# Patient Record
Sex: Male | Born: 1980 | Race: Black or African American | Hispanic: No | Marital: Single | Smoking: Never smoker
Health system: Southern US, Community
[De-identification: ages and names within clinical notes are randomized; demographics above are authoritative.]

## PROBLEM LIST (undated history)

## (undated) DIAGNOSIS — E119 Type 2 diabetes mellitus without complications: Secondary | ICD-10-CM

## (undated) DIAGNOSIS — F32A Depression, unspecified: Secondary | ICD-10-CM

## (undated) DIAGNOSIS — E669 Obesity, unspecified: Secondary | ICD-10-CM

## (undated) DIAGNOSIS — F329 Major depressive disorder, single episode, unspecified: Secondary | ICD-10-CM

## (undated) DIAGNOSIS — I10 Essential (primary) hypertension: Secondary | ICD-10-CM

---

## 2010-05-01 ENCOUNTER — Emergency Department (INDEPENDENT_AMBULATORY_CARE_PROVIDER_SITE_OTHER): Payer: Self-pay

## 2010-05-01 ENCOUNTER — Emergency Department (HOSPITAL_BASED_OUTPATIENT_CLINIC_OR_DEPARTMENT_OTHER)
Admission: EM | Admit: 2010-05-01 | Discharge: 2010-05-01 | Disposition: A | Discharge status: Home / Self Care | Payer: Self-pay | Attending: Emergency Medicine | Admitting: Emergency Medicine

## 2010-05-01 DIAGNOSIS — J069 Acute upper respiratory infection, unspecified: Secondary | ICD-10-CM | POA: Insufficient documentation

## 2010-05-01 DIAGNOSIS — R0789 Other chest pain: Secondary | ICD-10-CM

## 2010-05-01 DIAGNOSIS — I10 Essential (primary) hypertension: Secondary | ICD-10-CM | POA: Insufficient documentation

## 2010-05-01 DIAGNOSIS — R05 Cough: Secondary | ICD-10-CM

## 2010-05-01 DIAGNOSIS — R509 Fever, unspecified: Secondary | ICD-10-CM

## 2010-05-01 DIAGNOSIS — R059 Cough, unspecified: Secondary | ICD-10-CM | POA: Insufficient documentation

## 2010-07-26 ENCOUNTER — Inpatient Hospital Stay (INDEPENDENT_AMBULATORY_CARE_PROVIDER_SITE_OTHER)
Admission: RE | Admit: 2010-07-26 | Discharge: 2010-07-26 | Disposition: A | Discharge status: Home / Self Care | Payer: Self-pay | Source: Ambulatory Visit | Attending: Emergency Medicine | Admitting: Emergency Medicine

## 2010-07-26 DIAGNOSIS — L738 Other specified follicular disorders: Secondary | ICD-10-CM

## 2012-07-02 ENCOUNTER — Emergency Department (HOSPITAL_BASED_OUTPATIENT_CLINIC_OR_DEPARTMENT_OTHER)
Admission: EM | Admit: 2012-07-02 | Discharge: 2012-07-02 | Disposition: A | Discharge status: Home / Self Care | Payer: Self-pay | Attending: Emergency Medicine | Admitting: Emergency Medicine

## 2012-07-02 ENCOUNTER — Emergency Department (HOSPITAL_BASED_OUTPATIENT_CLINIC_OR_DEPARTMENT_OTHER): Payer: Self-pay

## 2012-07-02 ENCOUNTER — Encounter (HOSPITAL_BASED_OUTPATIENT_CLINIC_OR_DEPARTMENT_OTHER): Payer: Self-pay | Admitting: *Deleted

## 2012-07-02 DIAGNOSIS — Y929 Unspecified place or not applicable: Secondary | ICD-10-CM | POA: Insufficient documentation

## 2012-07-02 DIAGNOSIS — I10 Essential (primary) hypertension: Secondary | ICD-10-CM | POA: Insufficient documentation

## 2012-07-02 DIAGNOSIS — X58XXXA Exposure to other specified factors, initial encounter: Secondary | ICD-10-CM | POA: Insufficient documentation

## 2012-07-02 DIAGNOSIS — T148XXA Other injury of unspecified body region, initial encounter: Secondary | ICD-10-CM

## 2012-07-02 DIAGNOSIS — Y939 Activity, unspecified: Secondary | ICD-10-CM | POA: Insufficient documentation

## 2012-07-02 DIAGNOSIS — IMO0002 Reserved for concepts with insufficient information to code with codable children: Secondary | ICD-10-CM | POA: Insufficient documentation

## 2012-07-02 HISTORY — DX: Essential (primary) hypertension: I10

## 2012-07-02 LAB — URINALYSIS, ROUTINE W REFLEX MICROSCOPIC
Bilirubin Urine: NEGATIVE
Glucose, UA: NEGATIVE mg/dL
Hgb urine dipstick: NEGATIVE
Protein, ur: NEGATIVE mg/dL
Urobilinogen, UA: 1 mg/dL (ref 0.0–1.0)

## 2012-07-02 MED ORDER — IBUPROFEN 800 MG PO TABS: 800.0000 mg | ORAL_TABLET | Freq: Three times a day (TID) | ORAL | Status: DC

## 2012-07-02 MED ORDER — METHOCARBAMOL 500 MG PO TABS: 500.0000 mg | ORAL_TABLET | Freq: Two times a day (BID) | ORAL | Status: DC

## 2012-07-02 NOTE — ED Provider Notes (Signed)
History     CSN: [id number]  Arrival date & time 07/02/12  1528   First MD Initiated Contact with Patient 07/02/12 1636      Chief Complaint  Patient presents with  . Back Pain    (Consider location/radiation/quality/duration/timing/severity/associated sxs/prior treatment) Patient is a 32 y.o. male presenting with back pain. The history is provided by the patient.  Back Pain Location:  Lumbar spine Quality:  Aching Radiates to:  Does not radiate Pain severity:  Moderate Pain is:  Same all the time Onset quality:  Gradual Timing:  Constant Progression:  Worsening Chronicity:  New Relieved by:  Nothing Worsened by:  Nothing tried Ineffective treatments:  None tried Pt repor  Past Medical History  Diagnosis Date  . Hypertension     History reviewed. No pertinent past surgical history.  No family history on file.  History  Substance Use Topics  . Smoking status: Never Smoker   . Smokeless tobacco: Not on file  . Alcohol Use: No      Review of Systems  Musculoskeletal: Positive for back pain.  All other systems reviewed and are negative.    Allergies  Review of patient's allergies indicates no known allergies.  Home Medications   Current Outpatient Rx  Name  Route  Sig  Dispense  Refill  . LISINOPRIL PO   Oral   Take by mouth.         Marland Kitchen ibuprofen (ADVIL,MOTRIN) 800 MG tablet   Oral   Take 1 tablet (800 mg total) by mouth 3 (three) times daily.   21 tablet   0   . methocarbamol (ROBAXIN) 500 MG tablet   Oral   Take 1 tablet (500 mg total) by mouth 2 (two) times daily.   20 tablet   0     BP 159/101  Pulse 88  Temp(Src) 98.5 F (36.9 C) (Oral)  Resp 20  Ht 6' 2.5" (1.892 m)  Wt 375 lb (170.099 kg)  BMI 47.52 kg/m2  SpO2 100%  Physical Exam  Nursing note and vitals reviewed. Constitutional: He appears well-developed.  HENT:  Head: Normocephalic and atraumatic.  Eyes: Pupils are equal, round, and reactive to light.  Neck: Normal  range of motion.  Cardiovascular: Normal rate.   Pulmonary/Chest: Effort normal.  Abdominal: Soft.  Musculoskeletal:  Tender thoracic spine diffusely  Neurological: He is alert.  Skin: Skin is warm.    ED Course  Procedures (including critical care time)  Labs Reviewed  URINALYSIS, ROUTINE W REFLEX MICROSCOPIC - Abnormal; Notable for the following:    Color, Urine AMBER (*)    Specific Gravity, Urine 1.031 (*)    Ketones, ur 15 (*)    All other components within normal limits   Dg Chest 2 View  07/02/2012   *RADIOLOGY REPORT*  Clinical Data: Pain  CHEST - 2 VIEW  Comparison: 05/01/2010  Findings: The heart size and mediastinal contours are within normal limits.  Both lungs are clear.  The visualized skeletal structures are unremarkable.  IMPRESSION: Negative exam.   Original Report Authenticated By: Signa Kell, M.D.     1. Muscle strain       MDM  Ibuprofen and robaxin        Elson Areas, PA-C 07/02/12 1928

## 2012-07-02 NOTE — ED Notes (Signed)
Mid back pain x 3 weeks.

## 2012-07-02 NOTE — ED Notes (Signed)
No trouble walking or urinating.

## 2012-07-03 NOTE — ED Provider Notes (Signed)
Medical screening examination/treatment/procedure(s) were performed by non-physician practitioner and as supervising physician I was immediately available for consultation/collaboration.   Mcarthur Ivins, MD 07/03/12 1504 

## 2016-02-29 ENCOUNTER — Emergency Department (HOSPITAL_BASED_OUTPATIENT_CLINIC_OR_DEPARTMENT_OTHER)
Admission: EM | Admit: 2016-02-29 | Discharge: 2016-02-29 | Disposition: A | Discharge status: Home / Self Care | Payer: Self-pay | Attending: Emergency Medicine | Admitting: Emergency Medicine

## 2016-02-29 ENCOUNTER — Encounter (HOSPITAL_BASED_OUTPATIENT_CLINIC_OR_DEPARTMENT_OTHER): Payer: Self-pay | Admitting: Emergency Medicine

## 2016-02-29 ENCOUNTER — Emergency Department (HOSPITAL_BASED_OUTPATIENT_CLINIC_OR_DEPARTMENT_OTHER): Payer: Self-pay

## 2016-02-29 DIAGNOSIS — R51 Headache: Secondary | ICD-10-CM | POA: Insufficient documentation

## 2016-02-29 DIAGNOSIS — Z79899 Other long term (current) drug therapy: Secondary | ICD-10-CM | POA: Insufficient documentation

## 2016-02-29 DIAGNOSIS — R002 Palpitations: Secondary | ICD-10-CM | POA: Insufficient documentation

## 2016-02-29 DIAGNOSIS — R0789 Other chest pain: Secondary | ICD-10-CM

## 2016-02-29 DIAGNOSIS — H53149 Visual discomfort, unspecified: Secondary | ICD-10-CM | POA: Insufficient documentation

## 2016-02-29 DIAGNOSIS — R519 Headache, unspecified: Secondary | ICD-10-CM

## 2016-02-29 DIAGNOSIS — I10 Essential (primary) hypertension: Secondary | ICD-10-CM | POA: Insufficient documentation

## 2016-02-29 HISTORY — DX: Depression, unspecified: F32.A

## 2016-02-29 HISTORY — DX: Major depressive disorder, single episode, unspecified: F32.9

## 2016-02-29 HISTORY — DX: Obesity, unspecified: E66.9

## 2016-02-29 MED ORDER — ONDANSETRON HCL 4 MG/2ML IJ SOLN: 4.0000 mg | Freq: Once | INTRAMUSCULAR | Status: DC

## 2016-02-29 NOTE — ED Notes (Signed)
ED Provider at bedside discussing test results and dispo plan of care. 

## 2016-02-29 NOTE — ED Provider Notes (Signed)
[facility]   CSN: [id number] Arrival date & time: 02/29/16  1717  By signing my name below, I, Modena Jansky, attest that this documentation has been prepared under the direction and in the presence of Geoffery Lyons, MD. Electronically Signed: Modena Jansky, Scribe. 02/29/2016. 6:12 PM.  History   Chief Complaint Chief Complaint  Patient presents with  . Headache    The history is provided by the patient. No language interpreter was used.  Headache   This is a new problem. The current episode started more than 1 week ago. The problem occurs every few hours. The problem has not changed since onset.The headache is associated with emotional stress. The pain is located in the frontal region. The pain is moderate. Associated symptoms include palpitations.   HPI Comments: Wyatt Price is a 36 y.o. male who presents to the Emergency Department complaining of intermittent moderate right-sided frontal headache about a month ago. He states he has been stressed out at work today. He reports associated increased heart rate, photophobia, and chest tightness (onset today). He reports he has had URI-like symptoms and was diagnosed with bronchitis recently. He denies any recent head trauma, visual disturbance, numbness/tingling, or other complaints.     PCP: Johny Shears, MD  Past Medical History:  Diagnosis Date  . Depression   . Hypertension   . Obesity     There are no active problems to display for this patient.   History reviewed. No pertinent surgical history.     Home Medications    Prior to Admission medications   Medication Sig Start Date End Date Taking? Authorizing Provider  citalopram (CELEXA) 20 MG tablet Take 20 mg by mouth daily.   Yes Historical Provider, MD  ibuprofen (ADVIL,MOTRIN) 800 MG tablet Take 1 tablet (800 mg total) by mouth 3 (three) times daily. 07/02/12   Elson Areas, PA-C  LISINOPRIL PO Take by mouth.    Historical Provider,  MD  methocarbamol (ROBAXIN) 500 MG tablet Take 1 tablet (500 mg total) by mouth 2 (two) times daily. 07/02/12   Elson Areas, PA-C    Family History No family history on file.  Social History Social History  Substance Use Topics  . Smoking status: Never Smoker  . Smokeless tobacco: Never Used  . Alcohol use No     Allergies   Patient has no known allergies.   Review of Systems Review of Systems  Eyes: Negative for visual disturbance.  Respiratory: Positive for chest tightness.   Cardiovascular: Positive for palpitations.  Neurological: Positive for headaches. Negative for numbness.  All other systems reviewed and are negative.    Physical Exam Updated Vital Signs BP 147/99 (BP Location: Right Arm)   Pulse 82   Temp 98.3 F (36.8 C) (Oral)   Resp 16   Ht 6' 2.5" (1.892 m)   Wt (!) 360 lb 12.8 oz (163.7 kg)   SpO2 100%   BMI 45.70 kg/m   Physical Exam  Constitutional: He is oriented to person, place, and time. He appears well-developed and well-nourished.  HENT:  Head: Normocephalic and atraumatic.  Eyes: EOM are normal. Pupils are equal, round, and reactive to light.  Neck: Normal range of motion.  Cardiovascular: Normal rate, regular rhythm, normal heart sounds and intact distal pulses.   Pulmonary/Chest: Effort normal and breath sounds normal. No respiratory distress.  Abdominal: Soft. He exhibits no distension. There is no tenderness.  Musculoskeletal: Normal range of motion.  Neurological: He is alert and  oriented to person, place, and time. No cranial nerve deficit. He exhibits normal muscle tone. Coordination normal.  Skin: Skin is warm and dry.  Psychiatric: He has a normal mood and affect. Judgment normal.  Nursing note and vitals reviewed.    ED Treatments / Results  DIAGNOSTIC STUDIES: Oxygen Saturation is 100% on RA, normal by my interpretation.    COORDINATION OF CARE: 6:17 PM- Pt advised of plan for treatment and pt agrees.  Labs (all  labs ordered are listed, but only abnormal results are displayed) Labs Reviewed - No data to display  EKG  EKG Interpretation None       Radiology No results found.  Procedures Procedures (including critical care time)  Medications Ordered in ED Medications - No data to display   Initial Impression / Assessment and Plan / ED Course  I have reviewed the triage vital signs and the nursing notes.  Pertinent labs & imaging results that were available during my care of the patient were reviewed by me and considered in my medical decision making (see chart for details).  Patient presents here with complaints of headache that has been ongoing for the past several weeks. He is neurologically intact and his head CT is negative. He also reports an episode of tightness in his chest. EKG is unremarkable and chest x-ray is clear. I see no indication for further workup at this time. He will be discharged, to return as needed for any problems.  Final Clinical Impressions(s) / ED Diagnoses   Final diagnoses:  None    New Prescriptions New Prescriptions   No medications on file   I personally performed the services described in this documentation, which was scribed in my presence. The recorded information has been reviewed and is accurate.        Geoffery Lyonsouglas Slayter Moorhouse, MD 02/29/16 [phone]

## 2016-02-29 NOTE — Discharge Instructions (Signed)
Ibuprofen 600 mg every 6 hours as needed for pain.  Return to the ER if you develop worsening headache, visual disturbances, chest pains, or other new and concerning symptoms.

## 2016-02-29 NOTE — ED Triage Notes (Signed)
Pt having headaches approx 3 times/week for at least a month.  Takes Advil.  Wants to be sure "everything is OK."  Headache pain 1/10 at this time.  Also states he is having tightness in his chest today when he breathes.  Sts he was recently diagnosed with Bronchitis and the SOB started back again today.

## 2018-06-10 ENCOUNTER — Encounter (HOSPITAL_BASED_OUTPATIENT_CLINIC_OR_DEPARTMENT_OTHER): Payer: Self-pay | Admitting: *Deleted

## 2018-06-10 ENCOUNTER — Emergency Department (HOSPITAL_BASED_OUTPATIENT_CLINIC_OR_DEPARTMENT_OTHER)
Admission: EM | Admit: 2018-06-10 | Discharge: 2018-06-10 | Disposition: A | Discharge status: Home / Self Care | Payer: BLUE CROSS/BLUE SHIELD | Attending: Emergency Medicine | Admitting: Emergency Medicine

## 2018-06-10 ENCOUNTER — Other Ambulatory Visit: Payer: Self-pay

## 2018-06-10 ENCOUNTER — Emergency Department (HOSPITAL_BASED_OUTPATIENT_CLINIC_OR_DEPARTMENT_OTHER): Payer: BLUE CROSS/BLUE SHIELD

## 2018-06-10 DIAGNOSIS — R5383 Other fatigue: Secondary | ICD-10-CM | POA: Diagnosis not present

## 2018-06-10 DIAGNOSIS — M549 Dorsalgia, unspecified: Secondary | ICD-10-CM | POA: Insufficient documentation

## 2018-06-10 DIAGNOSIS — R000 Tachycardia, unspecified: Secondary | ICD-10-CM | POA: Diagnosis not present

## 2018-06-10 DIAGNOSIS — Z79899 Other long term (current) drug therapy: Secondary | ICD-10-CM | POA: Insufficient documentation

## 2018-06-10 DIAGNOSIS — R079 Chest pain, unspecified: Secondary | ICD-10-CM | POA: Diagnosis not present

## 2018-06-10 DIAGNOSIS — R05 Cough: Secondary | ICD-10-CM | POA: Diagnosis not present

## 2018-06-10 DIAGNOSIS — I10 Essential (primary) hypertension: Secondary | ICD-10-CM | POA: Insufficient documentation

## 2018-06-10 DIAGNOSIS — R030 Elevated blood-pressure reading, without diagnosis of hypertension: Secondary | ICD-10-CM

## 2018-06-10 LAB — CBC WITH DIFFERENTIAL/PLATELET
Abs Immature Granulocytes: 0.09 10*3/uL — ABNORMAL HIGH (ref 0.00–0.07)
Basophils Absolute: 0.1 10*3/uL (ref 0.0–0.1)
Basophils Relative: 1 %
Eosinophils Absolute: 0.3 10*3/uL (ref 0.0–0.5)
Eosinophils Relative: 2 %
HCT: 45.7 % (ref 39.0–52.0)
Hemoglobin: 13.4 g/dL (ref 13.0–17.0)
Immature Granulocytes: 1 %
Lymphocytes Relative: 28 %
Lymphs Abs: 4.4 10*3/uL — ABNORMAL HIGH (ref 0.7–4.0)
MCH: 21.8 pg — ABNORMAL LOW (ref 26.0–34.0)
MCHC: 29.3 g/dL — ABNORMAL LOW (ref 30.0–36.0)
MCV: 74.4 fL — ABNORMAL LOW (ref 80.0–100.0)
Monocytes Absolute: 1.5 10*3/uL — ABNORMAL HIGH (ref 0.1–1.0)
Monocytes Relative: 10 %
Neutro Abs: 9.3 10*3/uL — ABNORMAL HIGH (ref 1.7–7.7)
Neutrophils Relative %: 58 %
Platelets: 365 10*3/uL (ref 150–400)
RBC: 6.14 MIL/uL — ABNORMAL HIGH (ref 4.22–5.81)
RDW: 15.3 % (ref 11.5–15.5)
WBC: 15.7 10*3/uL — ABNORMAL HIGH (ref 4.0–10.5)
nRBC: 0 % (ref 0.0–0.2)

## 2018-06-10 LAB — BASIC METABOLIC PANEL
Anion gap: 7 (ref 5–15)
BUN: 7 mg/dL (ref 6–20)
CO2: 30 mmol/L (ref 22–32)
Calcium: 8.9 mg/dL (ref 8.9–10.3)
Chloride: 102 mmol/L (ref 98–111)
Creatinine, Ser: 0.87 mg/dL (ref 0.61–1.24)
GFR calc Af Amer: >60 mL/min (ref >60)
GFR calc non Af Amer: >60 mL/min (ref >60)
Glucose, Bld: 110 mg/dL — ABNORMAL HIGH (ref 70–99)
Potassium: 3.5 mmol/L (ref 3.5–5.1)
Sodium: 139 mmol/L (ref 135–145)

## 2018-06-10 LAB — URINALYSIS, ROUTINE W REFLEX MICROSCOPIC
Bilirubin Urine: NEGATIVE
Glucose, UA: NEGATIVE mg/dL
Hgb urine dipstick: NEGATIVE
Ketones, ur: NEGATIVE mg/dL
Leukocytes,Ua: NEGATIVE
Nitrite: NEGATIVE
Protein, ur: NEGATIVE mg/dL
Specific Gravity, Urine: 1.02 (ref 1.005–1.030)
pH: 6 (ref 5.0–8.0)

## 2018-06-10 LAB — RAPID URINE DRUG SCREEN, HOSP PERFORMED
Amphetamines: NOT DETECTED
Barbiturates: NOT DETECTED
Benzodiazepines: NOT DETECTED
Cocaine: NOT DETECTED
Opiates: NOT DETECTED
Tetrahydrocannabinol: NOT DETECTED

## 2018-06-10 LAB — TSH: TSH: 3.313 u[IU]/mL (ref 0.350–4.500)

## 2018-06-10 LAB — TROPONIN I: Troponin I: <0.03 ng/mL (ref <0.03)

## 2018-06-10 MED ORDER — IOHEXOL 350 MG/ML SOLN
100.0000 mL | Freq: Once | INTRAVENOUS | Status: AC | PRN
  Administered 2018-06-10: 100 mL via INTRAVENOUS

## 2018-06-10 MED ORDER — METHOCARBAMOL 500 MG PO TABS: 500.0000 mg | ORAL_TABLET | Freq: Two times a day (BID) | ORAL | 0 refills | Status: DC | PRN

## 2018-06-10 MED ORDER — NAPROXEN 500 MG PO TABS: 500.0000 mg | ORAL_TABLET | Freq: Two times a day (BID) | ORAL | 0 refills | Status: DC | PRN

## 2018-06-10 NOTE — ED Provider Notes (Signed)
[facility] EMERGENCY DEPARTMENT Provider [id number]: [id number] Arrival date & time: 06/10/18  1213    History   Chief Complaint No chief complaint on file.   HPI Wyatt Price is a 38 y.o. male.     The history is provided by the patient and medical records. No language interpreter was used.   Wyatt Price is a 38 y.o. male  with a PMH of HTN, obesity who presents to the Emergency Department with two complaints:  1. Left-sided back pain for approximately 3 weeks. Worse with twisting motions and certain position changes. Tried ibuprofen here and there, maybe some improvement, he isn't quite sure. No abdominal pain, n/v/d. No flank pain. Patient denies neck pain. No fever, saddle anesthesia, weakness, numbness, urinary complaints including dysuria, retention/incontinence. No history of cancer, IVDU, or recent spinal procedures.   2.  Chest pain, fatigue.  Patient states that 2 days ago, he woke up and felt very tired and much more fatigued than usual.  He just felt generally unwell.  He reports a slight cough here and there, but not persistent or bothering him per se.  This continued through the next 2 days, when he awoke this morning, however he developed a central and left-sided chest pain which he describes as " feeling irritated".  Cannot think of any alleviating factors.  He states that it occurs at rest, and times does seem to get worse with deep breathing, but other times deep breathing does not seem to affect pain.  Denies any shortness of breath.  No fevers.  No known sick contacts.    Past Medical History:  Diagnosis Date   Depression    Hypertension    Obesity     There are no active problems to display for this patient.   History reviewed. No pertinent surgical history.      Home Medications    Prior to Admission medications   Medication Sig Start Date End Date Taking? Authorizing Provider  LISINOPRIL PO Take by mouth.   Yes [provider]  venlafaxine XR (EFFEXOR-XR) 75 MG 24 hr capsule Take 75 mg by mouth daily with breakfast.   Yes [provider]  citalopram (CELEXA) 20 MG tablet Take 20 mg by mouth daily.    [provider]  methocarbamol (ROBAXIN) 500 MG tablet Take 1 tablet (500 mg total) by mouth 2 (two) times daily as needed for muscle spasms. 06/10/18   Gwendolyn Mclees, Chase Picket, PA-C  naproxen (NAPROSYN) 500 MG tablet Take 1 tablet (500 mg total) by mouth 2 (two) times daily as needed for mild pain or moderate pain. 06/10/18   Rogelio Waynick, Chase Picket, PA-C    Family History No family history on file.  Social History Social History   Tobacco Use   Smoking status: Never Smoker   Smokeless tobacco: Never Used  Substance Use Topics   Alcohol use: No   Drug use: No     Allergies   Patient has no known allergies.   Review of Systems Review of Systems  Constitutional: Positive for fatigue. Negative for fever.  Respiratory: Positive for cough. Negative for shortness of breath.   Cardiovascular: Positive for chest pain. Negative for palpitations and leg swelling.  Gastrointestinal: Negative for abdominal pain, diarrhea, nausea and vomiting.  Musculoskeletal: Positive for back pain. Negative for neck pain.  Neurological: Negative for weakness and numbness.  All other systems reviewed and are negative.    Physical Exam Updated Vital Signs BP (!) 143/101 (  BP Location: Right Arm)    Pulse 97    Temp 98.8 F (37.1 C) (Oral)    Resp 20    Ht 6\' 2"  (1.88 m)    Wt (!) 175.7 kg    SpO2 100%    BMI 49.73 kg/m   Physical Exam Vitals signs and nursing note reviewed.  Constitutional:      Appearance: He is well-developed.  Neck:     Comments: No midline or paraspinal tenderness. Full ROM without pain. Cardiovascular:     Rate and Rhythm: Regular rhythm.     Heart sounds: Normal heart sounds.  Pulmonary:     Effort: Pulmonary effort is normal. No respiratory distress.     Comments:  Lungs clear to auscultation bilaterally. Abdominal:     General: Bowel sounds are normal. There is no distension.     Palpations: Abdomen is soft.     Tenderness: There is no abdominal tenderness.  Musculoskeletal:       Back:     Comments: No midline T/L tenderness. Tenderness to palpation of left paraspinal musculature as depicted in image. 5/5 muscle strength in bilateral LE's. Straight leg raises are negative bilaterally for radicular symptoms. No calf tenderness. No edema. Able to ambulate independently with steady gait.  Skin:    General: Skin is warm and dry.     Findings: No erythema or rash.  Neurological:     Mental Status: He is alert and oriented to person, place, and time.     Deep Tendon Reflexes: Reflexes are normal and symmetric.     Comments: Bilateral lower extremities neurovascularly intact.      ED Treatments / Results  Labs (all labs ordered are listed, but only abnormal results are displayed) Labs Reviewed  BASIC METABOLIC PANEL - Abnormal; Notable for the following components:      Result Value   Glucose, Bld 110 (*)    All other components within normal limits  CBC WITH DIFFERENTIAL/PLATELET - Abnormal; Notable for the following components:   WBC 15.7 (*)    RBC 6.14 (*)    MCV 74.4 (*)    MCH 21.8 (*)    MCHC 29.3 (*)    Neutro Abs 9.3 (*)    Lymphs Abs 4.4 (*)    Monocytes Absolute 1.5 (*)    Abs Immature Granulocytes 0.09 (*)    All other components within normal limits  TROPONIN I  URINALYSIS, ROUTINE W REFLEX MICROSCOPIC  RAPID URINE DRUG SCREEN, HOSP PERFORMED  TSH    EKG EKG Interpretation  Date/Time:  Thursday Jun 10 2018 12:59:14 EDT Ventricular Rate:  106 PR Interval:    QRS Duration: 90 QT Interval:  320 QTC Calculation: 425 R Axis:   6 Text Interpretation:  Sinus tachycardia Borderline T wave abnormalities no sig change from previous Confirmed by Arby Barrette [phone]) on 06/10/2018 1:50:58 PM   Radiology Dg Chest 2  View  Result Date: 06/10/2018 CLINICAL DATA:  Bilateral chest pain EXAM: CHEST - 2 VIEW COMPARISON:  02/29/2016 FINDINGS: The cardiac size is at the upper limits of normal. Both lungs are clear. The visualized skeletal structures are unremarkable. IMPRESSION: No active cardiopulmonary disease. Electronically Signed   By: Katherine Mantle M.D.   On: 06/10/2018 13:33   Ct Angio Chest Pe W And/or Wo Contrast  Result Date: 06/10/2018 CLINICAL DATA:  Chest pain and tachycardia EXAM: CT ANGIOGRAPHY CHEST WITH CONTRAST TECHNIQUE: Multidetector CT imaging of the chest was performed using the standard protocol during  bolus administration of intravenous contrast. Multiplanar CT image reconstructions and MIPs were obtained to evaluate the vascular anatomy. CONTRAST:  OMNIPAQUE IOHEXOL 350 MG/ML SOLN COMPARISON:  Chest radiograph Jun 10, 2018 FINDINGS: Cardiovascular: There is no demonstrable pulmonary embolus. There is no thoracic aortic aneurysm or dissection. The visualized great vessels appear unremarkable. There is no evident pericardial effusion or pericardial thickening. The main pulmonary outflow tract measures 3.2 cm in diameter, prominent. Mediastinum/Nodes: Thyroid appears normal. There is no demonstrable thoracic adenopathy. No esophageal lesions are evident. Lungs/Pleura: There is no appreciable edema or consolidation. No pleural effusion or pleural thickening evident. There is a 5 x 4 mm nodular opacity in the anterior segment of the right upper lobe, seen on axial slice 46 series 5. Upper Abdomen: Visualized upper abdominal structures appear unremarkable. Musculoskeletal: There are no blastic or lytic bone lesions. There is no evident chest wall lesion. Review of the MIP images confirms the above findings. IMPRESSION: 1. No demonstrable pulmonary embolus. No thoracic aortic aneurysm or dissection. 2. Prominence of the main pulmonary outflow tract, a finding that may be indicative of a degree of  pulmonary arterial hypertension. 3. No edema or consolidation. 5 x 4 mm nodular opacity noted in the anterior segment right upper lobe. No follow-up needed if patient is low-risk. Non-contrast chest CT can be considered in 12 months if patient is high-risk. This recommendation follows the consensus statement: Guidelines for Management of Incidental Pulmonary Nodules Detected on CT Images: From the Fleischner Society 2017; Radiology 2017; 284:228-243. 4.  No demonstrable thoracic adenopathy. Electronically Signed   By: Bretta Bang III M.D.   On: 06/10/2018 14:59    Procedures Procedures (including critical care time)  Medications Ordered in ED Medications  iohexol (OMNIPAQUE) 350 MG/ML injection 100 mL (100 mLs Intravenous Contrast Given 06/10/18 1442)     Initial Impression / Assessment and Plan / ED Course  I have reviewed the triage vital signs and the nursing notes.  Pertinent labs & imaging results that were available during my care of the patient were reviewed by me and considered in my medical decision making (see chart for details).       Wyatt Price is a 38 y.o. male who presents to ED for two complaints:   1.  Left-sided back pain x 3 weeks.  Worse with movement.  No midline tenderness.  No urinary symptoms.  He demonstrates no lower extremity weakness, saddle anesthesia, bowel or bladder incontinence or neuro deficits. No concern for cauda equina. No fevers. Did obtain UA which showed no signs of infection or blood. Doubt stone or infectious etiology. Lower extremities are neurovascularly intact and patient is ambulating without difficulty. Likely msk in etiology. Will treat symptoms and have him follow up with PCP if not improving.    2. Fatigue two days ago, chest pain which began about 6am this morning. Afebrile, dynamically stable with normal cardiopulmonary examination.  EKG unchanged from previous without signs of acute ischemia.  Troponin WDL.  Doubt ACS.  History  and exam not consistent with dissection either.  Chest x-ray without pneumonia or other acute findings.  While on cardiac monitoring, he continued to be tachycardic in the 90-110 range. UDS was negative.  Given tachycardia with chest pain, proceeded with angios which showed no evidence of PE.  Findings of a degree of pulmonary arterial hypertension were seen.  I discussed this with the patient and recommended that he follow-up with his doctor about this as well as opacity noted in  right upper lobe of his lung. TSH obtained for follow-up with PCP.   Evaluation does not show pathology that would require ongoing emergent intervention or inpatient treatment.  Reasons to return to the ER were discussed.  PCP follow-up plan again encouraged.  All questions answered.  Patient discussed with Dr. Donnald GarrePfeiffer who agrees with treatment plan.    Final Clinical Impressions(s) / ED Diagnoses   Final diagnoses:  Chest pain  Elevated blood pressure reading  Back pain without radiculopathy    ED Discharge Orders         Ordered    methocarbamol (ROBAXIN) 500 MG tablet  2 times daily PRN     06/10/18 1523    naproxen (NAPROSYN) 500 MG tablet  2 times daily PRN     06/10/18 1523           Edmond Ginsberg, Chase PicketJaime Pilcher, PA-C 06/10/18 1530    Arby BarrettePfeiffer, Marcy, MD 06/25/18 1435

## 2018-06-10 NOTE — Discharge Instructions (Addendum)
It was my pleasure taking care of you today!   Naproxen as needed for back pain. Robaxin is your muscle relaxer to take as needed.   Call your primary care doctor to schedule a follow up appointment to discuss your hospital visit today and recheck your blood pressure.   Return to ER for new or worsening symptoms, any additional concerns.

## 2018-06-10 NOTE — ED Triage Notes (Signed)
Fatigue, slight cough, pain in his left scapula with no known injury. Nasal pain.

## 2018-06-10 NOTE — ED Notes (Signed)
ED Provider at bedside. 

## 2018-08-11 ENCOUNTER — Other Ambulatory Visit: Payer: Self-pay

## 2018-08-11 ENCOUNTER — Ambulatory Visit (INDEPENDENT_AMBULATORY_CARE_PROVIDER_SITE_OTHER): Payer: BC Managed Care – PPO

## 2018-08-11 ENCOUNTER — Ambulatory Visit (HOSPITAL_COMMUNITY)
Admission: EM | Admit: 2018-08-11 | Discharge: 2018-08-11 | Disposition: A | Discharge status: Home / Self Care | Payer: BC Managed Care – PPO | Attending: Emergency Medicine | Admitting: Emergency Medicine

## 2018-08-11 ENCOUNTER — Encounter (HOSPITAL_COMMUNITY): Payer: Self-pay

## 2018-08-11 DIAGNOSIS — R5383 Other fatigue: Secondary | ICD-10-CM

## 2018-08-11 DIAGNOSIS — Z20828 Contact with and (suspected) exposure to other viral communicable diseases: Secondary | ICD-10-CM | POA: Diagnosis not present

## 2018-08-11 MED ORDER — ALBUTEROL SULFATE HFA 108 (90 BASE) MCG/ACT IN AERS: 1.0000 | INHALATION_SPRAY | Freq: Four times a day (QID) | RESPIRATORY_TRACT | 0 refills | Status: AC | PRN

## 2018-08-11 NOTE — ED Triage Notes (Addendum)
Pt states he feels fatigue this started yesterday. 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 Pt states he needs a work note.

## 2018-08-11 NOTE — Discharge Instructions (Signed)
COVID test pending We will call if positive  Albuterol as needed for shortness of breath, chest tightness  Follow up if symptoms not resolving or worsening

## 2018-08-12 NOTE — ED Provider Notes (Signed)
[facility]    CSN: [id number] Arrival date & time: 08/11/18  1026      History   Chief Complaint Chief Complaint  Patient presents with  . Fatigue    HPI Wyatt Price is a 38 y.o. male history of obesity, depression, hypertension presenting today for evaluation of fatigue. He notes of recently he has been felt more fatigued. He has noticed he has also had a slight shortness of breath and chest tightness. Overall, minimal cough. Denies congestion, sore throat. Denies fever, chills, body aches. 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 Denies known exposure to COVID. Denies leg pain or leg swelling. Denies previous DVT/PE.   HPI  Past Medical History:  Diagnosis Date  . Depression   . Hypertension   . Obesity     There are no active problems to display for this patient.   History reviewed. No pertinent surgical history.     Home Medications    Prior to Admission medications   Medication Sig Start Date End Date Taking? Authorizing Provider  albuterol (VENTOLIN HFA) 108 (90 Base) MCG/ACT inhaler Inhale 1-2 puffs into the lungs every 6 (six) hours as needed for wheezing or shortness of breath. 08/11/18   Luiza Carranco C, PA-C  citalopram (CELEXA) 20 MG tablet Take 20 mg by mouth daily.    [provider]  LISINOPRIL PO Take by mouth.    [provider]  methocarbamol (ROBAXIN) 500 MG tablet Take 1 tablet (500 mg total) by mouth 2 (two) times daily as needed for muscle spasms. 06/10/18   Ward, Chase PicketJaime Pilcher, PA-C  naproxen (NAPROSYN) 500 MG tablet Take 1 tablet (500 mg total) by mouth 2 (two) times daily as needed for mild pain or moderate pain. 06/10/18   Ward, Chase PicketJaime Pilcher, PA-C  venlafaxine XR (EFFEXOR-XR) 75 MG 24 hr capsule Take 75 mg by mouth daily with breakfast.    [provider]    Family History History reviewed. No pertinent family history.  Social History Social History   Tobacco Use  .  Smoking status: Never Smoker  . Smokeless tobacco: Never Used  Substance Use Topics  . Alcohol use: No  . Drug use: No     Allergies   Patient has no known allergies.   Review of Systems Review of Systems  Constitutional: Positive for fatigue. Negative for activity change, appetite change, chills and fever.  HENT: Negative for congestion, ear pain, rhinorrhea, sinus pressure, sore throat and trouble swallowing.   Eyes: Negative for discharge and redness.  Respiratory: Positive for chest tightness and shortness of breath. Negative for cough.   Cardiovascular: Negative for chest pain.  Gastrointestinal: Negative for abdominal pain, diarrhea, nausea and vomiting.  Musculoskeletal: Negative for myalgias.  Skin: Negative for rash.  Neurological: Negative for dizziness, light-headedness and headaches.     Physical Exam Triage Vital Signs ED Triage Vitals  Enc Vitals Group     BP 08/11/18 1127 (!) 139/102     Pulse Rate 08/11/18 1127 64     Resp 08/11/18 1127 18     Temp 08/11/18 1127 97.7 F (36.5 C)     Temp src --      SpO2 08/11/18 1127 100 %     Weight 08/11/18 1116 (!) 387 lb (175.5 kg)     Height --      Head Circumference --      Peak Flow --      Pain Score 08/11/18  1116 0     Pain Loc --      Pain Edu? --      Excl. in GC? --    No data found.  Updated Vital Signs BP (!) 139/102 (BP Location: Right Arm)   Pulse 64   Temp 97.7 F (36.5 C)   Resp 18   Wt (!) 387 lb (175.5 kg)   SpO2 100%   BMI 49.69 kg/m   Visual Acuity Right Eye Distance:   Left Eye Distance:   Bilateral Distance:    Right Eye Near:   Left Eye Near:    Bilateral Near:     Physical Exam Vitals signs and nursing note reviewed.  Constitutional:      Appearance: He is well-developed.  HENT:     Head: Normocephalic and atraumatic.     Ears:     Comments: Bilateral ears without tenderness to palpation of external auricle, tragus and mastoid, EAC's without erythema or swelling,  TM's with good bony landmarks and cone of light. Non erythematous.     Mouth/Throat:     Comments: Oral mucosa pink and moist, no tonsillar enlargement or exudate. Posterior pharynx patent and nonerythematous, no uvula deviation or swelling. Normal phonation.  Eyes:     Conjunctiva/sclera: Conjunctivae normal.  Neck:     Musculoskeletal: Neck supple.  Cardiovascular:     Rate and Rhythm: Normal rate and regular rhythm.     Heart sounds: No murmur.  Pulmonary:     Effort: Pulmonary effort is normal. No respiratory distress.     Breath sounds: Normal breath sounds.     Comments: Breathing comfortably at rest, CTABL, no wheezing, rales or other adventitious sounds auscultated  No anterior chest tenderness Abdominal:     Palpations: Abdomen is soft.     Tenderness: There is no abdominal tenderness.  Musculoskeletal:     Comments: Lower extremities symmetric, no calf swelling or tenderness.   Skin:    General: Skin is warm and dry.  Neurological:     Mental Status: He is alert.      UC Treatments / Results  Labs (all labs ordered are listed, but only abnormal results are displayed) Labs Reviewed  NOVEL CORONAVIRUS, NAA (HOSPITAL ORDER, SEND-OUT TO REF LAB)    EKG   Radiology Dg Chest 2 View  Result Date: 08/11/2018 CLINICAL DATA:  Shortness of breath, fatigue and chest tightness. Cough for 1-2 weeks. EXAM: CHEST - 2 VIEW COMPARISON:  Chest x-ray 06/10/2018 FINDINGS: The heart size and mediastinal contours are within normal limits. Both lungs are clear. The visualized skeletal structures are unremarkable. IMPRESSION: Normal chest x-ray. Electronically Signed   By: Rudie MeyerP.  Gallerani M.D.   On: 08/11/2018 12:30    Procedures Procedures (including critical care time)  Medications Ordered in UC Medications - No data to display  Initial Impression / Assessment and Plan / UC Course  I have reviewed the triage vital signs and the nursing notes.  Pertinent labs & imaging results  that were available during my care of the patient were reviewed by me and considered in my medical decision making (see chart for details).     Chest x ray negative. COVID swab pending. Will provide albuterol as needed for chest tightness, shortness of breath. Continue to rest and drink plenty of fluids. Discussed strict return precautions. Patient verbalized understanding and is agreeable with plan.  Final Clinical Impressions(s) / UC Diagnoses   Final diagnoses:  Fatigue, unspecified type  Discharge Instructions     COVID test pending We will call if positive  Albuterol as needed for shortness of breath, chest tightness  Follow up if symptoms not resolving or worsening   ED Prescriptions    Medication Sig Dispense Auth. Provider   albuterol (VENTOLIN HFA) 108 (90 Base) MCG/ACT inhaler Inhale 1-2 puffs into the lungs every 6 (six) hours as needed for wheezing or shortness of breath. 6.7 g Zakaria Fromer, [place] C, PA-C     Controlled Substance Prescriptions [place] Controlled Substance Registry consulted? Not Applicable   Janith Lima, [place] 08/12/18 2254

## 2018-08-15 LAB — NOVEL CORONAVIRUS, NAA (HOSP ORDER, SEND-OUT TO REF LAB; TAT 18-24 HRS): SARS-CoV-2, NAA: NOT DETECTED

## 2018-10-01 ENCOUNTER — Other Ambulatory Visit: Payer: Self-pay

## 2018-10-01 ENCOUNTER — Emergency Department (HOSPITAL_BASED_OUTPATIENT_CLINIC_OR_DEPARTMENT_OTHER)
Admission: EM | Admit: 2018-10-01 | Discharge: 2018-10-01 | Disposition: A | Discharge status: Home / Self Care | Payer: Self-pay | Attending: Emergency Medicine | Admitting: Emergency Medicine

## 2018-10-01 ENCOUNTER — Encounter (HOSPITAL_BASED_OUTPATIENT_CLINIC_OR_DEPARTMENT_OTHER): Payer: Self-pay | Admitting: Adult Health

## 2018-10-01 ENCOUNTER — Emergency Department (HOSPITAL_BASED_OUTPATIENT_CLINIC_OR_DEPARTMENT_OTHER): Payer: Self-pay

## 2018-10-01 DIAGNOSIS — E119 Type 2 diabetes mellitus without complications: Secondary | ICD-10-CM | POA: Insufficient documentation

## 2018-10-01 DIAGNOSIS — R5381 Other malaise: Secondary | ICD-10-CM

## 2018-10-01 DIAGNOSIS — Z79899 Other long term (current) drug therapy: Secondary | ICD-10-CM | POA: Insufficient documentation

## 2018-10-01 DIAGNOSIS — R5383 Other fatigue: Secondary | ICD-10-CM | POA: Insufficient documentation

## 2018-10-01 DIAGNOSIS — Z20828 Contact with and (suspected) exposure to other viral communicable diseases: Secondary | ICD-10-CM | POA: Insufficient documentation

## 2018-10-01 DIAGNOSIS — R51 Headache: Secondary | ICD-10-CM | POA: Insufficient documentation

## 2018-10-01 DIAGNOSIS — I10 Essential (primary) hypertension: Secondary | ICD-10-CM | POA: Insufficient documentation

## 2018-10-01 DIAGNOSIS — R05 Cough: Secondary | ICD-10-CM | POA: Insufficient documentation

## 2018-10-01 DIAGNOSIS — J069 Acute upper respiratory infection, unspecified: Secondary | ICD-10-CM | POA: Insufficient documentation

## 2018-10-01 HISTORY — DX: Type 2 diabetes mellitus without complications: E11.9

## 2018-10-01 LAB — CBC WITH DIFFERENTIAL/PLATELET
Abs Immature Granulocytes: 0.1 10*3/uL — ABNORMAL HIGH (ref 0.00–0.07)
Basophils Absolute: 0.1 10*3/uL (ref 0.0–0.1)
Basophils Relative: 1 %
Eosinophils Absolute: 0.2 10*3/uL (ref 0.0–0.5)
Eosinophils Relative: 1 %
HCT: 44.9 % (ref 39.0–52.0)
Hemoglobin: 13.2 g/dL (ref 13.0–17.0)
Immature Granulocytes: 1 %
Lymphocytes Relative: 22 %
Lymphs Abs: 3.3 10*3/uL (ref 0.7–4.0)
MCH: 21.9 pg — ABNORMAL LOW (ref 26.0–34.0)
MCHC: 29.4 g/dL — ABNORMAL LOW (ref 30.0–36.0)
MCV: 74.6 fL — ABNORMAL LOW (ref 80.0–100.0)
Monocytes Absolute: 1.7 10*3/uL — ABNORMAL HIGH (ref 0.1–1.0)
Monocytes Relative: 11 %
Neutro Abs: 9.5 10*3/uL — ABNORMAL HIGH (ref 1.7–7.7)
Neutrophils Relative %: 64 %
Platelets: 376 10*3/uL (ref 150–400)
RBC: 6.02 MIL/uL — ABNORMAL HIGH (ref 4.22–5.81)
RDW: 16 % — ABNORMAL HIGH (ref 11.5–15.5)
WBC: 15 10*3/uL — ABNORMAL HIGH (ref 4.0–10.5)
nRBC: 0 % (ref 0.0–0.2)

## 2018-10-01 LAB — BASIC METABOLIC PANEL
Anion gap: 9 (ref 5–15)
BUN: 8 mg/dL (ref 6–20)
CO2: 27 mmol/L (ref 22–32)
Calcium: 8.8 mg/dL — ABNORMAL LOW (ref 8.9–10.3)
Chloride: 105 mmol/L (ref 98–111)
Creatinine, Ser: 1.01 mg/dL (ref 0.61–1.24)
GFR calc Af Amer: >60 mL/min (ref >60)
GFR calc non Af Amer: >60 mL/min (ref >60)
Glucose, Bld: 104 mg/dL — ABNORMAL HIGH (ref 70–99)
Potassium: 4 mmol/L (ref 3.5–5.1)
Sodium: 141 mmol/L (ref 135–145)

## 2018-10-01 LAB — HEPATIC FUNCTION PANEL
ALT: 14 U/L (ref 0–44)
AST: 18 U/L (ref 15–41)
Albumin: 3.8 g/dL (ref 3.5–5.0)
Alkaline Phosphatase: 111 U/L (ref 38–126)
Bilirubin, Direct: <0.1 mg/dL (ref 0.0–0.2)
Total Bilirubin: 0.2 mg/dL — ABNORMAL LOW (ref 0.3–1.2)
Total Protein: 7.6 g/dL (ref 6.5–8.1)

## 2018-10-01 LAB — D-DIMER, QUANTITATIVE: D-Dimer, Quant: 0.37 ug/mL-FEU (ref 0.00–0.50)

## 2018-10-01 NOTE — ED Notes (Signed)
Unsuccessful IV stick at this time. Another RN to try via ultrasound.

## 2018-10-01 NOTE — ED Notes (Signed)
Pt lying flat for orthostatics 

## 2018-10-01 NOTE — ED Provider Notes (Signed)
[facility] EMERGENCY DEPARTMENT Provider Note   CSN: [id number] Arrival date & time: 10/01/18  1846     History   Chief Complaint Chief Complaint  Patient presents with  . Fatigue    HPI Wyatt Price is a 38 y.o. male.     The history is provided by the patient and medical records. No language interpreter was used.   Wyatt Price is a 38 y.o. male who presents to the Emergency Department complaining of fatigue. He started feeling poorly on Wednesday when he got to work with fatigue, generalized weakness.  He has associated dizziness, nonproductive cough, sneeze, mild headache.  Denies chest pain but has soreness with breathing/coughing.  Denies abdominal pain, N/V/D, dysuria, leg swelling/pain, sob.  Felt feverish.  No loss of taste or smell.  No known COVID19 exposures.  He works as a Writer.  He has HTN, DM, HPL.  He is compliant with HTN meds but sometimes misses his metformin.   Past Medical History:  Diagnosis Date  . Depression   . Diabetes mellitus without complication (HCC)   . Hypertension   . Obesity     There are no active problems to display for this patient.   History reviewed. No pertinent surgical history.      Home Medications    Prior to Admission medications   Medication Sig Start Date End Date Taking? Authorizing Provider  albuterol (VENTOLIN HFA) 108 (90 Base) MCG/ACT inhaler Inhale 1-2 puffs into the lungs every 6 (six) hours as needed for wheezing or shortness of breath. 08/11/18   Wieters, Hallie C, PA-C  citalopram (CELEXA) 20 MG tablet Take 20 mg by mouth daily.    [provider]  LISINOPRIL PO Take by mouth.    [provider]  methocarbamol (ROBAXIN) 500 MG tablet Take 1 tablet (500 mg total) by mouth 2 (two) times daily as needed for muscle spasms. 06/10/18   Ward, Chase Picket, PA-C  naproxen (NAPROSYN) 500 MG tablet Take 1 tablet (500 mg total) by mouth 2 (two) times daily as needed for mild pain or  moderate pain. 06/10/18   Ward, Chase Picket, PA-C  venlafaxine XR (EFFEXOR-XR) 75 MG 24 hr capsule Take 75 mg by mouth daily with breakfast.    [provider]    Family History History reviewed. No pertinent family history.  Social History Social History   Tobacco Use  . Smoking status: Never Smoker  . Smokeless tobacco: Never Used  Substance Use Topics  . Alcohol use: No  . Drug use: No     Allergies   Patient has no known allergies.   Review of Systems Review of Systems  All other systems reviewed and are negative.    Physical Exam Updated Vital Signs BP (!) 139/97 (BP Location: Right Arm)   Pulse (!) 109   Temp 98.2 F (36.8 C) (Oral)   Resp 18   Ht 6\' 2"  (1.88 m)   Wt (!) 176.7 kg   SpO2 100%   BMI 50.02 kg/m   Physical Exam Vitals signs and nursing note reviewed.  Constitutional:      Appearance: He is well-developed.  HENT:     Head: Normocephalic and atraumatic.  Cardiovascular:     Rate and Rhythm: Normal rate and regular rhythm.     Heart sounds: No murmur.  Pulmonary:     Effort: Pulmonary effort is normal. No respiratory distress.     Breath sounds: Normal breath sounds.  Abdominal:  Palpations: Abdomen is soft.     Tenderness: There is no abdominal tenderness. There is no guarding or rebound.  Musculoskeletal:        General: No swelling or tenderness.  Skin:    General: Skin is warm and dry.  Neurological:     Mental Status: He is alert and oriented to person, place, and time.  Psychiatric:        Behavior: Behavior normal.      ED Treatments / Results  Labs (all labs ordered are listed, but only abnormal results are displayed) Labs Reviewed  CBC WITH DIFFERENTIAL/PLATELET - Abnormal; Notable for the following components:      Result Value   WBC 15.0 (*)    RBC 6.02 (*)    MCV 74.6 (*)    MCH 21.9 (*)    MCHC 29.4 (*)    RDW 16.0 (*)    Neutro Abs 9.5 (*)    Monocytes Absolute 1.7 (*)    Abs Immature  Granulocytes 0.10 (*)    All other components within normal limits  BASIC METABOLIC PANEL - Abnormal; Notable for the following components:   Glucose, Bld 104 (*)    Calcium 8.8 (*)    All other components within normal limits  HEPATIC FUNCTION PANEL - Abnormal; Notable for the following components:   Total Bilirubin 0.2 (*)    All other components within normal limits  SARS CORONAVIRUS 2 (TAT 6-24 HRS)  D-DIMER, QUANTITATIVE (NOT AT [facility])    EKG None  Radiology Dg Chest 2 View  Result Date: 10/01/2018 CLINICAL DATA:  Dizziness and shortness of breath 3 days EXAM: CHEST - 2 VIEW COMPARISON:  08/11/2018 FINDINGS: The heart size and mediastinal contours are within normal limits. Both lungs are clear. The visualized skeletal structures are unremarkable. IMPRESSION: No active cardiopulmonary disease. Electronically Signed   By: Kathreen Devoid   On: 10/01/2018 19:35    Procedures Procedures (including critical care time)  Medications Ordered in ED Medications - No data to display   Initial Impression / Assessment and Plan / ED Course  I have reviewed the triage vital signs and the nursing notes.  Pertinent labs & imaging results that were available during my care of the patient were reviewed by me and considered in my medical decision making (see chart for details).        Patient with history of hypertension, diabetes here for evaluation of malaise, dizziness, cough. He is non-toxic appearing on evaluation with no respiratory distress. He has a family history of blood clots. Labs significant for leukocytosis, similar when compared to prior in May. Otherwise no significant electrolyte abnormality. D dimer is negative, presentation is not consistent with PE, CVA, pneumonia, CHF. Given upper respiratory symptoms, recommend COVID evaluation. Discussed with patient home care for URI, malaise. Discussed outpatient follow-up and return precautions.  Wyatt Price was evaluated in  Emergency Department on 10/01/2018 for the symptoms described in the history of present illness. He was evaluated in the context of the global COVID-19 pandemic, which necessitated consideration that the patient might be at risk for infection with the SARS-CoV-2 virus that causes COVID-19. Institutional protocols and algorithms that pertain to the evaluation of patients at risk for COVID-19 are in a state of rapid change based on information released by regulatory bodies including the CDC and federal and state organizations. These policies and algorithms were followed during the patient's care in the ED.   Final Clinical Impressions(s) / ED Diagnoses   Final  diagnoses:  Malaise  Viral upper respiratory tract infection    ED Discharge Orders    None       Tilden Fossaees, Georgana Romain, MD 10/01/18 2338

## 2018-10-01 NOTE — ED Triage Notes (Signed)
Presents with fatigue, cough, chest congestions and "just not feeling good" This has been going onfor 3 days. Denies fevers, but endorses chills.

## 2018-10-03 LAB — SARS CORONAVIRUS 2 (TAT 6-24 HRS): SARS Coronavirus 2: NEGATIVE

## 2019-11-10 IMAGING — CR DG CHEST 2V
3 series · 3 of 3 positions shown · non-contrast
Comparison: 08/11/2018

CLINICAL DATA: Dizziness and shortness of breath 3 days

EXAM:
CHEST - 2 VIEW

[w chest pa]
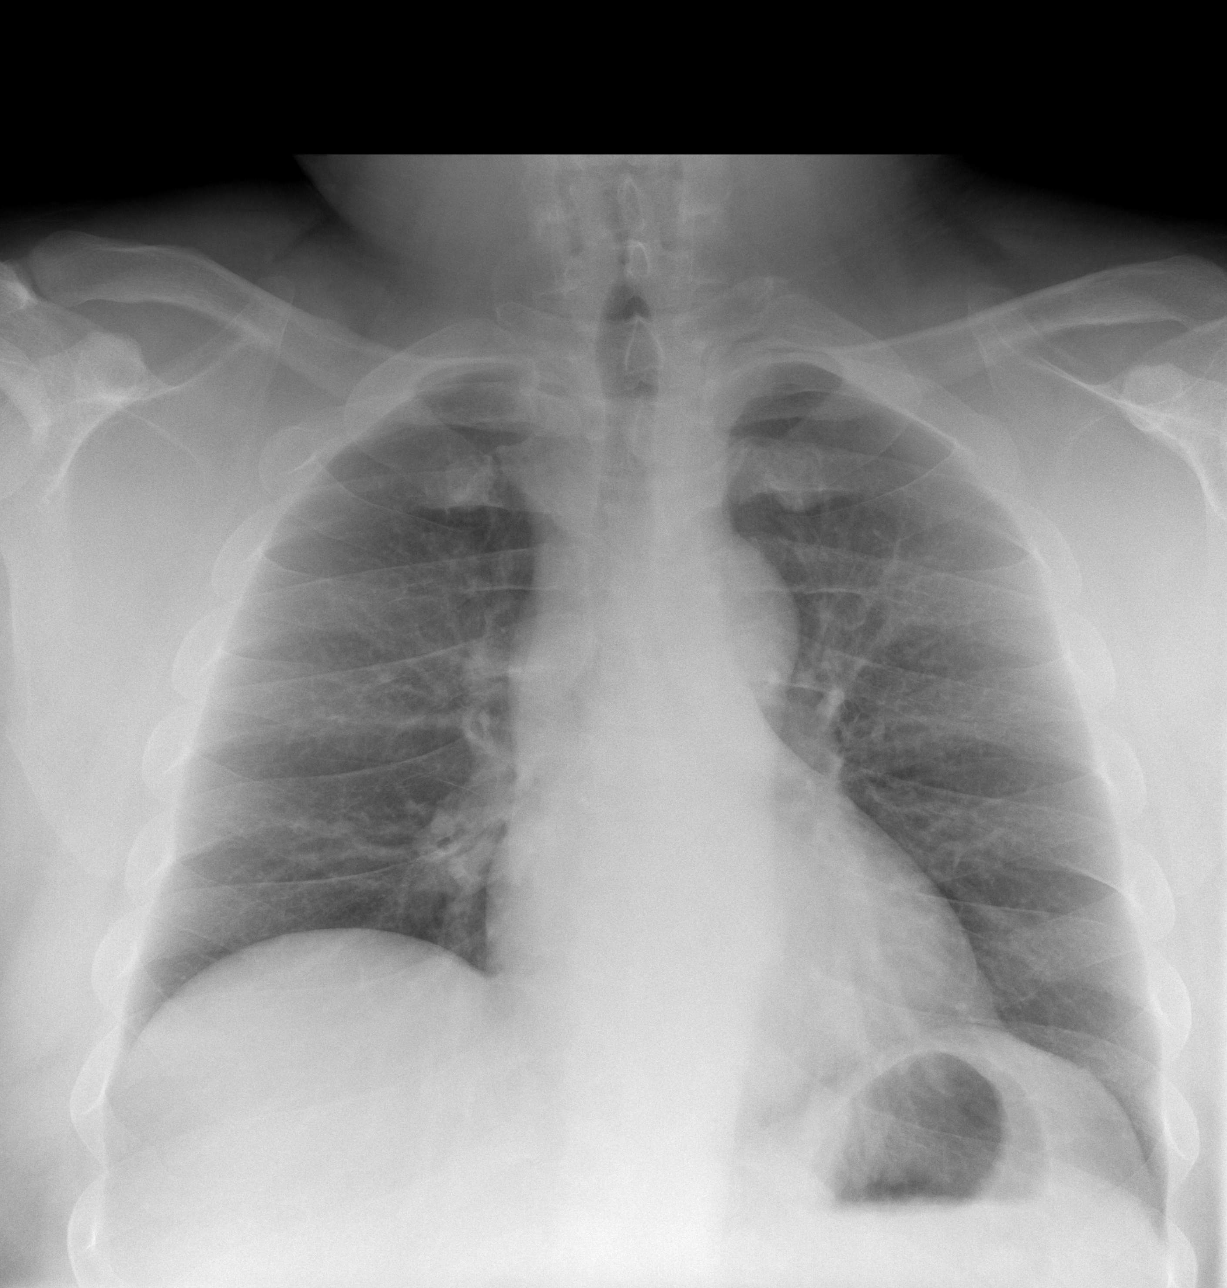

[w chest lat *]
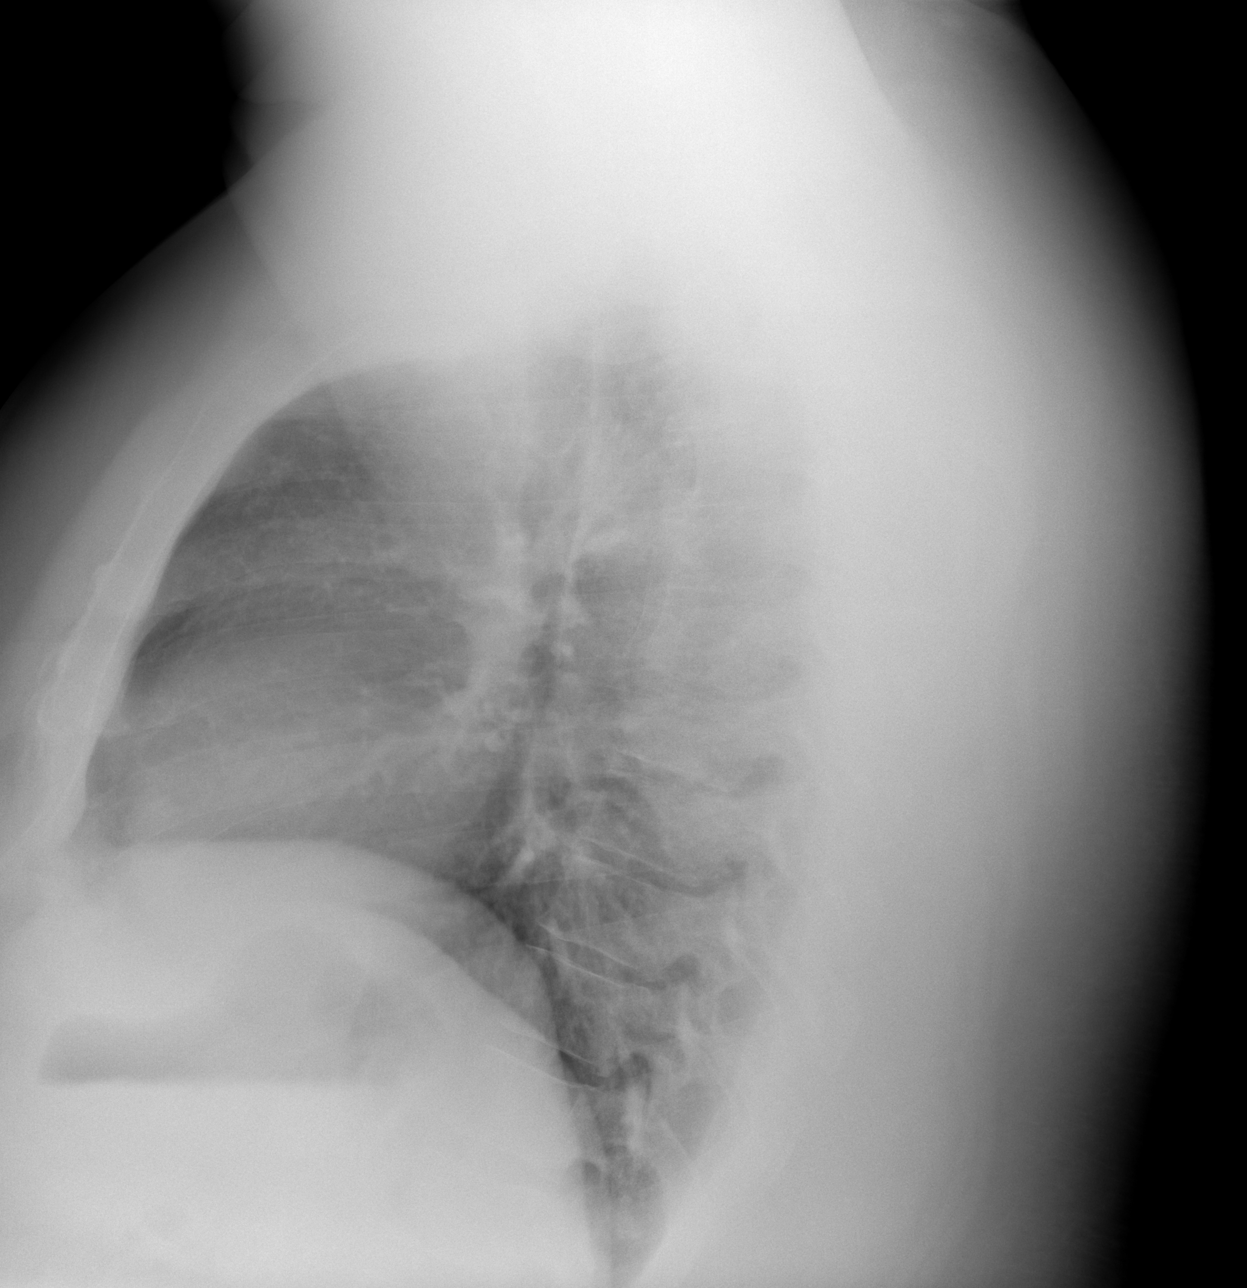

[w chest lat]
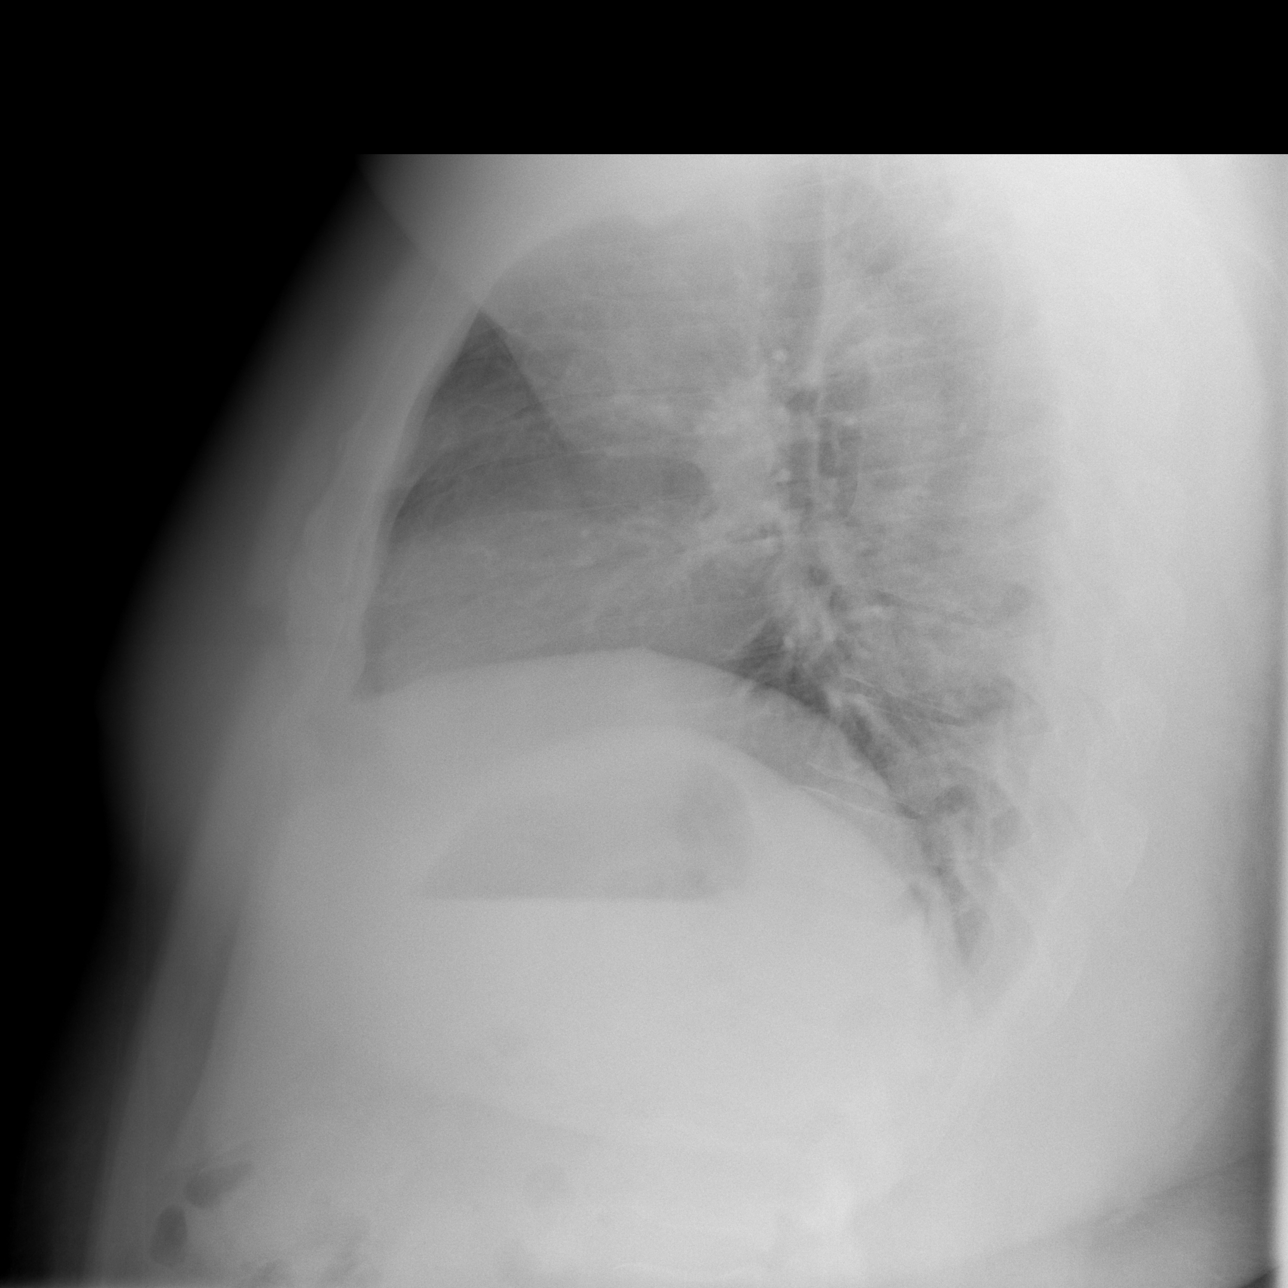

[3 of 3 positions shown; findings below may reference images not displayed]

FINDINGS: The heart size and mediastinal contours are within normal limits.
Both lungs are clear. The visualized skeletal structures are
unremarkable.
IMPRESSION: No active cardiopulmonary disease.

## 2021-01-07 ENCOUNTER — Encounter (HOSPITAL_BASED_OUTPATIENT_CLINIC_OR_DEPARTMENT_OTHER): Payer: Self-pay | Admitting: *Deleted

## 2021-01-07 ENCOUNTER — Other Ambulatory Visit: Payer: Self-pay

## 2021-01-07 ENCOUNTER — Emergency Department (HOSPITAL_BASED_OUTPATIENT_CLINIC_OR_DEPARTMENT_OTHER)
Admission: EM | Admit: 2021-01-07 | Discharge: 2021-01-07 | Disposition: A | Discharge status: Home / Self Care | Payer: Self-pay | Attending: Emergency Medicine | Admitting: Emergency Medicine

## 2021-01-07 DIAGNOSIS — M545 Low back pain, unspecified: Secondary | ICD-10-CM

## 2021-01-07 DIAGNOSIS — Z79899 Other long term (current) drug therapy: Secondary | ICD-10-CM | POA: Insufficient documentation

## 2021-01-07 DIAGNOSIS — Z20822 Contact with and (suspected) exposure to covid-19: Secondary | ICD-10-CM | POA: Insufficient documentation

## 2021-01-07 DIAGNOSIS — M549 Dorsalgia, unspecified: Secondary | ICD-10-CM | POA: Insufficient documentation

## 2021-01-07 DIAGNOSIS — I10 Essential (primary) hypertension: Secondary | ICD-10-CM | POA: Insufficient documentation

## 2021-01-07 DIAGNOSIS — E119 Type 2 diabetes mellitus without complications: Secondary | ICD-10-CM | POA: Insufficient documentation

## 2021-01-07 LAB — RESP PANEL BY RT-PCR (FLU A&B, COVID) ARPGX2
Influenza A by PCR: NEGATIVE
Influenza B by PCR: NEGATIVE
SARS Coronavirus 2 by RT PCR: NEGATIVE

## 2021-01-07 MED ORDER — KETOROLAC TROMETHAMINE 30 MG/ML IJ SOLN
30.0000 mg | Freq: Once | INTRAMUSCULAR | Status: AC
  Administered 2021-01-07: 12:00:00 30 mg via INTRAMUSCULAR
  Filled 2021-01-07: qty 1

## 2021-01-07 MED ORDER — LIDOCAINE 5 % EX PTCH: 1.0000 | MEDICATED_PATCH | CUTANEOUS | 0 refills | Status: AC

## 2021-01-07 MED ORDER — NAPROXEN 500 MG PO TABS: 500.0000 mg | ORAL_TABLET | Freq: Two times a day (BID) | ORAL | 0 refills | Status: AC

## 2021-01-07 MED ORDER — LIDOCAINE 5 % EX PTCH
1.0000 | MEDICATED_PATCH | CUTANEOUS | Status: DC
  Administered 2021-01-07: 12:00:00 1 via TRANSDERMAL
  Filled 2021-01-07: qty 1

## 2021-01-07 NOTE — Discharge Instructions (Addendum)
Use Lidoderm patches as needed for the pain.  Take naproxen twice daily for the next week, take it with food and water. Take the next 2 days off of work, continue stretching your back at home.  Follow-up with your primary care doctor symptoms persist for greater than a week.

## 2021-01-07 NOTE — ED Provider Notes (Signed)
[name] DEPART[facility] Provider Note   CSN: [id number] Arrival date & time: 01/07/21  1121     History Chief Complaint  Patient presents with   Back Pain    Wyatt Price is a 40 y.o. male.  HPI  Patient presents with back pain.  Started about 4 days ago, its intermittent worse when he ambulates.  It is primarily to the left side of his back and radiates down his left leg.  He did start a new job which is more physically exerting requires him to stand more.  The pain worsens throughout the day after working.  He is tried some Tylenol with minimal relief.  He did take Aleve which worked better.  No previous back surgeries, no numbness in the groin or lower extremities.  No prior chemo or radiation.  Past Medical History:  Diagnosis Date   Depression    Diabetes mellitus without complication ([place])    Hypertension    Obesity     There are no problems to display for this patient.   History reviewed. No pertinent surgical history.     No family history on file.  Social History   Tobacco Use   Smoking status: Never   Smokeless tobacco: Never  Vaping Use   Vaping Use: Never used  Substance Use Topics   Alcohol use: No   Drug use: No    Home Medications Prior to Admission medications   Medication Sig Start Date End Date Taking? Authorizing Provider  LISINOPRIL PO Take by mouth.   Yes [provider]  albuterol (VENTOLIN HFA) 108 (90 Base) MCG/ACT inhaler Inhale 1-2 puffs into the lungs every 6 (six) hours as needed for wheezing or shortness of breath. 08/11/18   Wieters, Hallie C, PA-C  citalopram (CELEXA) 20 MG tablet Take 20 mg by mouth daily.    [provider]  methocarbamol (ROBAXIN) 500 MG tablet Take 1 tablet (500 mg total) by mouth 2 (two) times daily as needed for muscle spasms. 06/10/18   Ward, Ozella Almond, PA-C  naproxen (NAPROSYN) 500 MG tablet Take 1 tablet (500 mg total) by mouth 2 (two) times daily as needed for  mild pain or moderate pain. 06/10/18   Ward, Ozella Almond, PA-C  venlafaxine XR (EFFEXOR-XR) 75 MG 24 hr capsule Take 75 mg by mouth daily with breakfast.    [provider]    Allergies    Patient has no known allergies.  Review of Systems   Review of Systems  Constitutional:  Negative for fever.  Eyes:  Negative for visual disturbance.  Genitourinary:  Negative for decreased urine volume, dysuria, flank pain, frequency and hematuria.       No urinary retention   Musculoskeletal:  Positive for back pain.  Skin:  Negative for rash.  Allergic/Immunologic: Negative for immunocompromised state.  Neurological:  Negative for dizziness and syncope.       No saddle anesthesia, no bilateral leg numbness    Physical Exam Updated Vital Signs BP (!) 156/113 (BP Location: Right Arm)    Pulse (!) 121    Temp 98.2 F (36.8 C) (Oral)    Resp 18    Ht 6\' 2"  (1.88 m)    Wt (!) 171.9 kg    SpO2 100%    BMI 48.66 kg/m   Physical Exam Vitals and nursing note reviewed. Exam conducted with a chaperone present.  Constitutional:      Appearance: Normal appearance.  HENT:     Head:  Normocephalic.  Eyes:     Extraocular Movements: Extraocular movements intact.     Pupils: Pupils are equal, round, and reactive to light.     Comments: No nystagmus   Neck:     Comments: No midline cervical tenderness. No palpable deformities.  Cardiovascular:     Rate and Rhythm: Normal rate and regular rhythm.     Pulses: Normal pulses.     Comments: DP, PT, and radial pulses 2+ and symmetrical bilaterally Pulmonary:     Effort: Pulmonary effort is normal.     Breath sounds: Normal breath sounds.  Abdominal:     Tenderness: There is no right CVA tenderness or left CVA tenderness.  Musculoskeletal:        General: Tenderness present.     Cervical back: Normal range of motion. No rigidity or tenderness.     Comments: Left paraspinal tenderness, negative straight leg test  Skin:    General: Skin is warm  and dry.     Capillary Refill: Capillary refill takes less than 2 seconds.     Findings: No bruising or erythema.  Neurological:     Mental Status: He is alert and oriented to person, place, and time. Mental status is at baseline.     Comments: Patient is alert, oriented to personal, place and time with normal speech. Cranial nerves III-XII grossly in tact. Grip strength equal bilaterally LE strength equal bilaterally. Sensation to light touch in tact bilaterally. No gait abnormalities, patient ambulatory.    Psychiatric:        Mood and Affect: Mood normal.    ED Results / Procedures / Treatments   Labs (all labs ordered are listed, but only abnormal results are displayed) Labs Reviewed  RESP PANEL BY RT-PCR (FLU A&B, COVID) [id number]    EKG None  Radiology No results found.  Procedures Procedures   Medications Ordered in ED Medications  lidocaine (LIDODERM) 5 % 1 patch (1 patch Transdermal Patch Applied 01/07/21 1205)  ketorolac (TORADOL) 30 MG/ML injection 30 mg (30 mg Intramuscular Given 01/07/21 1204)    ED Course  I have reviewed the triage vital signs and the nursing notes.  Pertinent labs & imaging results that were available during my care of the patient were reviewed by me and considered in my medical decision making (see chart for details).    MDM Rules/Calculators/A&P                         Back pain most consistent with musculoskeletal spasm/strain.  Patient is neurologically intact, no focal deficits on exam. Patient is ambulatory.  Patient is mildly tachycardic likely secondary to pain, no chest pain or shortness of breath.  Patient is not febrile.  Doubt infectious process.  No red flag symptoms - history of malignancy, prior spinal surgeries, IV drug use, severe trauma, neurologic deficits, fevers, prolonged corticosteroid use.  Doubt cauda equina - no bladder or bowel retention/incontinence, bilateral leg numbness or weakness, saddle anesthesia. Doubt  osteomyelitis or epidural abscess - no history of IVDU, fever, vertebral tenderness Doubt pyelonephritis - no CVAT, urinary complaints Doubt dissection - equal peripheral pulses, no tachycardia, story not consistent       Final Clinical Impression(s) / ED Diagnoses Final diagnoses:  None    Rx / DC Orders ED Discharge Orders     None        Theron Arista, Cordelia Poche 01/07/21 1258    Cheryll Cockayne, MD 01/10/21 2334

## 2021-01-07 NOTE — ED Triage Notes (Addendum)
Lower back pain with radiation into his left leg. States he started a new job last week. Heart rate 121 noted. Pt states he has had a recent sinus infection and may have had a fever. No fever at this time.

## 2021-01-17 ENCOUNTER — Other Ambulatory Visit: Payer: Self-pay

## 2021-01-17 ENCOUNTER — Encounter (HOSPITAL_BASED_OUTPATIENT_CLINIC_OR_DEPARTMENT_OTHER): Payer: Self-pay | Admitting: *Deleted

## 2021-01-17 ENCOUNTER — Emergency Department (HOSPITAL_BASED_OUTPATIENT_CLINIC_OR_DEPARTMENT_OTHER)
Admission: EM | Admit: 2021-01-17 | Discharge: 2021-01-17 | Disposition: A | Discharge status: Home / Self Care | Payer: Self-pay | Attending: Emergency Medicine | Admitting: Emergency Medicine

## 2021-01-17 DIAGNOSIS — M5442 Lumbago with sciatica, left side: Secondary | ICD-10-CM | POA: Insufficient documentation

## 2021-01-17 DIAGNOSIS — E119 Type 2 diabetes mellitus without complications: Secondary | ICD-10-CM | POA: Insufficient documentation

## 2021-01-17 DIAGNOSIS — Z79899 Other long term (current) drug therapy: Secondary | ICD-10-CM | POA: Insufficient documentation

## 2021-01-17 MED ORDER — CYCLOBENZAPRINE HCL 10 MG PO TABS: 10.0000 mg | ORAL_TABLET | Freq: Three times a day (TID) | ORAL | 0 refills | Status: AC | PRN

## 2021-01-17 MED ORDER — KETOROLAC TROMETHAMINE 15 MG/ML IJ SOLN
15.0000 mg | Freq: Once | INTRAMUSCULAR | Status: AC
  Administered 2021-01-17: 15 mg via INTRAMUSCULAR
  Filled 2021-01-17: qty 1

## 2021-01-17 MED ORDER — PREDNISONE 20 MG PO TABS: 40.0000 mg | ORAL_TABLET | Freq: Every day | ORAL | 0 refills | Status: AC

## 2021-01-17 NOTE — ED Provider Notes (Signed)
[facility] EMERGENCY DEPARTMENT Provider Note   CSN: [id number] Arrival date & time: 01/17/21  1222     History  Chief Complaint  Patient presents with   Back Pain    Wyatt Price is a 41 y.o. male.  41 y.o male with a PMH Borderline Diabetes, Sciatica presents to the ED with a chief complaint of left sided back pain x 2 days. This is a recurrent symptoms for patient, he was recently on 01/07/2021 for the the same complaint. He was treated previously with naproxen, lidocaine patches without any improvement in his symptoms.  He does report he currently got a new job, and has to stand a lot for, wearing safety toe shoes.  Has try medication, over-the-counter anti-inflammatories without any improvement.  He does not have any prior history of cancer, no urinary symptoms, no difficulty with his gait.  No trauma.  The history is provided by the patient and medical records.  Back Pain Location:  Lumbar spine Quality:  Shooting Radiates to:  L posterior upper leg Pain severity:  Moderate Pain is:  Worse during the night Onset quality:  Gradual Duration:  2 days Timing:  Constant Progression:  Worsening Chronicity:  Recurrent Associated symptoms: no fever       Home Medications Prior to Admission medications   Medication Sig Start Date End Date Taking? Authorizing Provider  cyclobenzaprine (FLEXERIL) 10 MG tablet Take 1 tablet (10 mg total) by mouth 3 (three) times daily as needed for up to 7 days for muscle spasms. 01/17/21 01/24/21 Yes Kaelum Kissick, Leonie Douglas, PA-C  predniSONE (DELTASONE) 20 MG tablet Take 2 tablets (40 mg total) by mouth daily for 5 days. 01/17/21 01/22/21 Yes Atwood Adcock, Leonie Douglas, PA-C  albuterol (VENTOLIN HFA) 108 (90 Base) MCG/ACT inhaler Inhale 1-2 puffs into the lungs every 6 (six) hours as needed for wheezing or shortness of breath. 08/11/18   Wieters, Hallie C, PA-C  citalopram (CELEXA) 20 MG tablet Take 20 mg by mouth daily.    [provider]  lidocaine  (LIDODERM) 5 % Place 1 patch onto the skin daily. Remove & Discard patch within 12 hours or as directed by MD 01/07/21   Theron Arista, PA-C  LISINOPRIL PO Take by mouth.    [provider]  naproxen (NAPROSYN) 500 MG tablet Take 1 tablet (500 mg total) by mouth 2 (two) times daily. 01/07/21   Theron Arista, PA-C  venlafaxine XR (EFFEXOR-XR) 75 MG 24 hr capsule Take 75 mg by mouth daily with breakfast.    [provider]      Allergies    Patient has no known allergies.    Review of Systems   Review of Systems  Constitutional:  Negative for chills and fever.  Genitourinary:  Negative for difficulty urinating and flank pain.  Musculoskeletal:  Positive for back pain. Negative for gait problem.   Physical Exam Updated Vital Signs BP (!) 154/96 (BP Location: Right Arm)    Pulse (!) 104    Temp 98.1 F (36.7 C) (Oral)    Resp 20    Ht 6\' 2"  (1.88 m)    Wt (!) 171.9 kg    SpO2 100%    BMI 48.66 kg/m  Physical Exam Vitals and nursing note reviewed.  Constitutional:      Appearance: He is well-developed.  HENT:     Head: Normocephalic and atraumatic.  Eyes:     General: No scleral icterus.    Pupils: Pupils are equal, round, and reactive to light.  Cardiovascular:     Heart sounds: Normal heart sounds.  Pulmonary:     Effort: Pulmonary effort is normal.     Breath sounds: Normal breath sounds. No wheezing.  Chest:     Chest wall: No tenderness.  Abdominal:     General: Bowel sounds are normal. There is no distension.     Palpations: Abdomen is soft.     Tenderness: There is no abdominal tenderness.  Musculoskeletal:        General: No deformity.     Cervical back: Normal range of motion.     Lumbar back: Tenderness present. No edema, signs of trauma or bony tenderness. Normal range of motion.     Comments: RLE- KF,KE 5/5 strength LLE- HF, HE 5/5 strength Normal gait. No pronator drift. No leg drop.  CN I, II and VIII not tested. CN II-XII grossly intact  bilaterally.      Skin:    General: Skin is warm and dry.  Neurological:     Mental Status: He is alert and oriented to person, place, and time.    ED Results / Procedures / Treatments   Labs (all labs ordered are listed, but only abnormal results are displayed) Labs Reviewed - No data to display  EKG None  Radiology No results found.  Procedures Procedures    Medications Ordered in ED Medications  ketorolac (TORADOL) 15 MG/ML injection 15 mg (has no administration in time range)    ED Course/ Medical Decision Making/ A&P                           Medical Decision Making  Patient presents to the ED with a chief complaint of lower back pain that is been ongoing for the past 2 days.  Previously seen in the ED for the same complaint.  On today's arrival patient is ambulatory with a steady gait.  During my evaluation there is some pain noted to the left lower back, radiating down his left upper leg, consistent with likely MSK process.  No red flags such as prior history of cancer, saddle anesthesia, bowel or bladder complaints.  Did not feel that imaging was necessary on today's visit.  We did discuss treatment with supportive therapy along with referral to spine specialist.  He is agreeable of this at this time.  He was also requesting steroids, I did note his last A1C was slightly elevated, he reports he is not diabetic at this time.  We will continue with treatment with steroids, muscle relaxers along with provided with Toradol while in the ED.  He is agreeable to plan and treatment at this time, patient stable for discharge   Portions of this note were generated with Dragon dictation software. Dictation errors may occur despite best attempts at proofreading.  Final Clinical Impression(s) / ED Diagnoses Final diagnoses:  Acute left-sided low back pain with left-sided sciatica    Rx / DC Orders ED Discharge Orders          Ordered    cyclobenzaprine (FLEXERIL) 10 MG  tablet  3 times daily PRN        01/17/21 1458    predniSONE (DELTASONE) 20 MG tablet  Daily        01/17/21 1458              Claude Manges, PA-C 01/17/21 1500    Tegeler, Canary Brim, MD 01/17/21 1501

## 2021-01-17 NOTE — Discharge Instructions (Addendum)
I have prescribed muscle relaxers for your pain, please do not drink or drive while taking this medications as it can make you drowsy.   I have also prescribed steroids, be aware this medication can cause insomnia, appetite changes.    Please follow-up with PCP in 1 week for reevaluation of your symptoms.If you experience any bowel or bladder incontinence, fever, worsening in your symptoms please return to the ED.  

## 2021-01-17 NOTE — ED Notes (Signed)
D/c paperwork reviewed with pt, including prescription. Pt with no questions or concerns at time of d/c. Ambulatory to ED exit without assistance.  

## 2021-01-17 NOTE — ED Triage Notes (Signed)
Back pain for 2 weeks. States his pain is getting worse. Pt is ambulatory.

## 2021-04-18 ENCOUNTER — Encounter (HOSPITAL_BASED_OUTPATIENT_CLINIC_OR_DEPARTMENT_OTHER): Payer: Self-pay | Admitting: Emergency Medicine

## 2021-04-18 ENCOUNTER — Other Ambulatory Visit: Payer: Self-pay

## 2021-04-18 ENCOUNTER — Emergency Department (HOSPITAL_BASED_OUTPATIENT_CLINIC_OR_DEPARTMENT_OTHER)
Admission: EM | Admit: 2021-04-18 | Discharge: 2021-04-18 | Disposition: A | Discharge status: Home / Self Care | Payer: BC Managed Care – PPO | Attending: Emergency Medicine | Admitting: Emergency Medicine

## 2021-04-18 DIAGNOSIS — E119 Type 2 diabetes mellitus without complications: Secondary | ICD-10-CM | POA: Insufficient documentation

## 2021-04-18 DIAGNOSIS — R000 Tachycardia, unspecified: Secondary | ICD-10-CM | POA: Insufficient documentation

## 2021-04-18 DIAGNOSIS — Z79899 Other long term (current) drug therapy: Secondary | ICD-10-CM | POA: Insufficient documentation

## 2021-04-18 DIAGNOSIS — I10 Essential (primary) hypertension: Secondary | ICD-10-CM | POA: Insufficient documentation

## 2021-04-18 DIAGNOSIS — D72829 Elevated white blood cell count, unspecified: Secondary | ICD-10-CM | POA: Diagnosis not present

## 2021-04-18 DIAGNOSIS — E876 Hypokalemia: Secondary | ICD-10-CM | POA: Diagnosis not present

## 2021-04-18 LAB — BASIC METABOLIC PANEL
Anion gap: 10 (ref 5–15)
BUN: 10 mg/dL (ref 6–20)
CO2: 29 mmol/L (ref 22–32)
Calcium: 8.8 mg/dL — ABNORMAL LOW (ref 8.9–10.3)
Chloride: 98 mmol/L (ref 98–111)
Creatinine, Ser: 1.06 mg/dL (ref 0.61–1.24)
GFR, Estimated: >60 mL/min (ref >60)
Glucose, Bld: 136 mg/dL — ABNORMAL HIGH (ref 70–99)
Potassium: 3.3 mmol/L — ABNORMAL LOW (ref 3.5–5.1)
Sodium: 137 mmol/L (ref 135–145)

## 2021-04-18 LAB — CBC
HCT: 46.1 % (ref 39.0–52.0)
Hemoglobin: 14.3 g/dL (ref 13.0–17.0)
MCH: 22.1 pg — ABNORMAL LOW (ref 26.0–34.0)
MCHC: 31 g/dL (ref 30.0–36.0)
MCV: 71.4 fL — ABNORMAL LOW (ref 80.0–100.0)
Platelets: 384 10*3/uL (ref 150–400)
RBC: 6.46 MIL/uL — ABNORMAL HIGH (ref 4.22–5.81)
RDW: 15.8 % — ABNORMAL HIGH (ref 11.5–15.5)
WBC: 14.6 10*3/uL — ABNORMAL HIGH (ref 4.0–10.5)
nRBC: 0 % (ref 0.0–0.2)

## 2021-04-18 LAB — TSH: TSH: 1.243 u[IU]/mL (ref 0.350–4.500)

## 2021-04-18 MED ORDER — SODIUM CHLORIDE 0.9 % IV BOLUS
1000.0000 mL | Freq: Once | INTRAVENOUS | Status: AC
  Administered 2021-04-18: 1000 mL via INTRAVENOUS

## 2021-04-18 MED ORDER — POTASSIUM CHLORIDE CRYS ER 20 MEQ PO TBCR
20.0000 meq | EXTENDED_RELEASE_TABLET | Freq: Once | ORAL | Status: AC
  Administered 2021-04-18: 20 meq via ORAL
  Filled 2021-04-18: qty 1

## 2021-04-18 NOTE — Discharge Instructions (Addendum)
You were evaluated for tachycardia. There was no sign of heart attack or pulmonary embolism. Your white blood cell count and heart rate have been consistently high for a period of 2 years of emergency department visits. I recommend follow up with a primary care doctor. I ordered a thyroid study and results will be available on MyChart.  ?

## 2021-04-18 NOTE — ED Provider Notes (Signed)
?[facility] EMERGENCY DEPARTMENT ?Provider Note ? ? ?CSN: [id number] ?Arrival date & time: 04/18/21  1404 ? ?  ? ?History ? ?Chief Complaint  ?Patient presents with  ? Tachycardia  ? ? ?Wyatt Price is a 41 y.o. male.  Patient presents with concerns about persistent tachycardia.  Patient was evaluated at an outside clinic who stated that there may have been an abnormality on EKG, but then stated that it may have been due to lead placement.  Patient denies chest pain, denies shortness of breath.  Patient was diagnosed with anxiety at the previous visit.  PMH significant for hypertension, obesity, depression, DM ? ?HPI ? ?  ? ?Home Medications ?Prior to Admission medications   ?Medication Sig Start Date End Date Taking? Authorizing Provider  ?albuterol (VENTOLIN HFA) 108 (90 Base) MCG/ACT inhaler Inhale 1-2 puffs into the lungs every 6 (six) hours as needed for wheezing or shortness of breath. 08/11/18   Wieters, Hallie C, PA-C  ?citalopram (CELEXA) 20 MG tablet Take 20 mg by mouth daily.    [provider]  ?lidocaine (LIDODERM) 5 % Place 1 patch onto the skin daily. Remove & Discard patch within 12 hours or as directed by MD 01/07/21   Theron Arista, PA-C  ?LISINOPRIL PO Take by mouth.    [provider]  ?naproxen (NAPROSYN) 500 MG tablet Take 1 tablet (500 mg total) by mouth 2 (two) times daily. 01/07/21   Theron Arista, PA-C  ?venlafaxine XR (EFFEXOR-XR) 75 MG 24 hr capsule Take 75 mg by mouth daily with breakfast.    [provider]  ?   ? ?Allergies    ?Patient has no known allergies.   ? ?Review of Systems   ?Review of Systems  ?Constitutional:  Positive for unexpected weight change (Patient lost approximately 20lbs over 4 months). Negative for fever.  ?Respiratory:  Negative for shortness of breath.   ?Cardiovascular:  Negative for chest pain.  ?Gastrointestinal:  Negative for abdominal pain.  ?Neurological:  Negative for light-headedness and headaches.  ? ?Physical  Exam ?Updated Vital Signs ?BP 127/87   Pulse (!) 101   Temp 98.5 ?F (36.9 ?C) (Oral)   Resp 17   Ht 6\' 2"  (1.88 m)   Wt (!) 161.9 kg   SpO2 98%   BMI 45.84 kg/m?  ?Physical Exam ?Vitals and nursing note reviewed.  ?Constitutional:   ?   General: He is not in acute distress. ?   Appearance: He is obese.  ?HENT:  ?   Head: Normocephalic.  ?Eyes:  ?   Conjunctiva/sclera: Conjunctivae normal.  ?Cardiovascular:  ?   Rate and Rhythm: Regular rhythm. Tachycardia present.  ?   Pulses: Normal pulses.  ?   Heart sounds: Normal heart sounds.  ?Pulmonary:  ?   Effort: Pulmonary effort is normal.  ?   Breath sounds: Normal breath sounds.  ?Musculoskeletal:     ?   General: Normal range of motion.  ?Skin: ?   General: Skin is warm and dry.  ?Neurological:  ?   Mental Status: He is alert.  ? ? ?ED Results / Procedures / Treatments   ?Labs ?(all labs ordered are listed, but only abnormal results are displayed) ?Labs Reviewed  ?BASIC METABOLIC PANEL - Abnormal; Notable for the following components:  ?    Result Value  ? Potassium 3.3 (*)   ? Glucose, Bld 136 (*)   ? Calcium 8.8 (*)   ? All other components within normal limits  ?CBC -  Abnormal; Notable for the following components:  ? WBC 14.6 (*)   ? RBC 6.46 (*)   ? MCV 71.4 (*)   ? MCH 22.1 (*)   ? RDW 15.8 (*)   ? All other components within normal limits  ?TSH  ? ? ?EKG ?EKG Interpretation ? ?Date/Time:  Thursday April 18 2021 14:15:31 EDT ?Ventricular Rate:  110 ?PR Interval:  154 ?QRS Duration: 96 ?QT Interval:  301 ?QTC Calculation: 408 ?R Axis:   43 ?Text Interpretation: Sinus tachycardia Borderline repolarization abnormality No significant change since last tracing Confirmed by Vanetta MuldersZackowski, Scott [phone] on 04/18/2021 2:30:30 PM ? ?Radiology ?No results found. ? ?Procedures ?Procedures  ? ? ?Medications Ordered in ED ?Medications  ?potassium chloride SA (KLOR-CON M) CR tablet 20 mEq (has no administration in time range)  ?sodium chloride 0.9 % bolus 1,000 mL (1,000 mLs  Intravenous New Bag/Given 04/18/21 1451)  ? ? ?ED Course/ Medical Decision Making/ A&P ?  ?                        ?Medical Decision Making ?Amount and/or Complexity of Data Reviewed ?Labs: ordered. ? ? ?This patient presents to the ED for concern of tachycardia, this involves an extensive number of treatment options, and is a complaint that carries with it a high risk of complications and morbidity.  The differential diagnosis includes but is not limited to hypothyroidism, PE, MI, anemia, fever ? ? ?Co morbidities that complicate the patient evaluation ? ?History of anxiety ? ? ?Additional history obtained: ? ?Additional history obtained from N/A ?External records from outside source obtained and reviewed including emergency department notes from December 2022 and January 2023 ? ? ?Lab Tests: ? ?I Ordered, and personally interpreted labs.  The pertinent results include: WBC 14.6, potassium 3.3 ? ? ?Cardiac Monitoring: / EKG: ? ?The patient was maintained on a cardiac monitor.  I personally viewed and interpreted the cardiac monitored which showed an underlying rhythm of: sinus tachycardia ? ? ?Problem List / ED Course / Critical interventions / Medication management ? ?I ordered medication including Kclor for hypokalemia and normal saline ?Reevaluation of the patient after these medicines showed that the patient improved ?I have reviewed the patients home medicines and have made adjustments as needed ? ?Test / Admission - Considered: ? ?The patient presents with tachycardia. The patient has no chest pain and no shortness of breath. Very low clinical suspicion for PE.  The patient has no EKG changes to show possible ischemic pattern.  ACS very unlikely.  The patient is afebrile.  The patient does have a history of anxiety which could increase heart rate.  That being said, the patient's heart rate has been increased at multiple emergency department visits with very consistent rates in the low 100s.  He could have  anxiety from coming to the hospital, or this is likely not anxiety related.  I do have some concern for possible hyperthyroidism.  I have ordered a TSH.  The patient currently has no primary care.  I have recommended he establish primary care for further evaluation.  The patient has a consistently high white count with all previous visits being somewhere between 14-16,000 WBC.  Strongly recommend follow-up with primary care provider.  I will provide resources to the patient.  Discharge home ? ?Final Clinical Impression(s) / ED Diagnoses ?Final diagnoses:  ?Tachycardia  ? ? ?Rx / DC Orders ?ED Discharge Orders   ? ? None  ? ?  ? ? ?  ?  Darrick Grinder, PA-C ?04/18/21 1544 ? ?  ?Vanetta Mulders, MD ?04/20/21 1727 ? ?

## 2021-04-18 NOTE — ED Triage Notes (Signed)
Pt reports he has been having "rapid heart rate" that started on Monday. Pt states he was seen by his PCP and was prescribed anti anxiety medication and was told there was something "abnormal" on his EKG.  ?

## 2022-03-05 ENCOUNTER — Emergency Department (HOSPITAL_BASED_OUTPATIENT_CLINIC_OR_DEPARTMENT_OTHER): Payer: BC Managed Care – PPO

## 2022-03-05 ENCOUNTER — Encounter (HOSPITAL_BASED_OUTPATIENT_CLINIC_OR_DEPARTMENT_OTHER): Payer: Self-pay | Admitting: Pediatrics

## 2022-03-05 ENCOUNTER — Emergency Department (HOSPITAL_BASED_OUTPATIENT_CLINIC_OR_DEPARTMENT_OTHER)
Admission: EM | Admit: 2022-03-05 | Discharge: 2022-03-05 | Disposition: A | Discharge status: Home / Self Care | Payer: BC Managed Care – PPO | Attending: Emergency Medicine | Admitting: Emergency Medicine

## 2022-03-05 ENCOUNTER — Other Ambulatory Visit: Payer: Self-pay

## 2022-03-05 ENCOUNTER — Other Ambulatory Visit (HOSPITAL_BASED_OUTPATIENT_CLINIC_OR_DEPARTMENT_OTHER): Payer: Self-pay

## 2022-03-05 DIAGNOSIS — J3489 Other specified disorders of nose and nasal sinuses: Secondary | ICD-10-CM | POA: Diagnosis not present

## 2022-03-05 DIAGNOSIS — Z79899 Other long term (current) drug therapy: Secondary | ICD-10-CM | POA: Diagnosis not present

## 2022-03-05 DIAGNOSIS — E119 Type 2 diabetes mellitus without complications: Secondary | ICD-10-CM | POA: Diagnosis not present

## 2022-03-05 DIAGNOSIS — R030 Elevated blood-pressure reading, without diagnosis of hypertension: Secondary | ICD-10-CM | POA: Diagnosis not present

## 2022-03-05 DIAGNOSIS — R509 Fever, unspecified: Secondary | ICD-10-CM | POA: Insufficient documentation

## 2022-03-05 DIAGNOSIS — R0789 Other chest pain: Secondary | ICD-10-CM | POA: Diagnosis not present

## 2022-03-05 DIAGNOSIS — R0602 Shortness of breath: Secondary | ICD-10-CM | POA: Diagnosis not present

## 2022-03-05 DIAGNOSIS — Z20822 Contact with and (suspected) exposure to covid-19: Secondary | ICD-10-CM | POA: Diagnosis not present

## 2022-03-05 DIAGNOSIS — I10 Essential (primary) hypertension: Secondary | ICD-10-CM | POA: Insufficient documentation

## 2022-03-05 DIAGNOSIS — R0981 Nasal congestion: Secondary | ICD-10-CM

## 2022-03-05 LAB — CBC
HCT: 44.4 % (ref 39.0–52.0)
Hemoglobin: 13.3 g/dL (ref 13.0–17.0)
MCH: 21.5 pg — ABNORMAL LOW (ref 26.0–34.0)
MCHC: 30 g/dL (ref 30.0–36.0)
MCV: 71.7 fL — ABNORMAL LOW (ref 80.0–100.0)
Platelets: 349 10*3/uL (ref 150–400)
RBC: 6.19 MIL/uL — ABNORMAL HIGH (ref 4.22–5.81)
RDW: 16.9 % — ABNORMAL HIGH (ref 11.5–15.5)
WBC: 11.6 10*3/uL — ABNORMAL HIGH (ref 4.0–10.5)
nRBC: 0 % (ref 0.0–0.2)

## 2022-03-05 LAB — BASIC METABOLIC PANEL
Anion gap: 7 (ref 5–15)
BUN: 8 mg/dL (ref 6–20)
CO2: 30 mmol/L (ref 22–32)
Calcium: 8.4 mg/dL — ABNORMAL LOW (ref 8.9–10.3)
Chloride: 100 mmol/L (ref 98–111)
Creatinine, Ser: 1.14 mg/dL (ref 0.61–1.24)
GFR, Estimated: >60 mL/min (ref >60)
Glucose, Bld: 131 mg/dL — ABNORMAL HIGH (ref 70–99)
Potassium: 4.5 mmol/L (ref 3.5–5.1)
Sodium: 137 mmol/L (ref 135–145)

## 2022-03-05 LAB — RESP PANEL BY RT-PCR (RSV, FLU A&B, COVID)  RVPGX2
Influenza A by PCR: NEGATIVE
Influenza B by PCR: NEGATIVE
Resp Syncytial Virus by PCR: NEGATIVE
SARS Coronavirus 2 by RT PCR: NEGATIVE

## 2022-03-05 LAB — D-DIMER, QUANTITATIVE: D-Dimer, Quant: <0.27 ug/mL-FEU (ref 0.00–0.50)

## 2022-03-05 LAB — TROPONIN I (HIGH SENSITIVITY)
Troponin I (High Sensitivity): 8 ng/L (ref <18)
Troponin I (High Sensitivity): 8 ng/L (ref <18)

## 2022-03-05 MED ORDER — AMLODIPINE BESYLATE 5 MG PO TABS
5.0000 mg | ORAL_TABLET | Freq: Every day | ORAL | 0 refills | Status: DC
  Filled 2022-03-05: qty 30, 30d supply, fill #0

## 2022-03-05 MED ORDER — GUAIFENESIN-DM 100-10 MG/5ML PO SYRP
5.0000 mL | ORAL_SOLUTION | ORAL | 0 refills | Status: AC | PRN
  Filled 2022-03-05: qty 118, 4d supply, fill #0

## 2022-03-05 MED ORDER — AMLODIPINE BESYLATE 5 MG PO TABS
5.0000 mg | ORAL_TABLET | Freq: Once | ORAL | Status: AC
  Administered 2022-03-05: 5 mg via ORAL
  Filled 2022-03-05: qty 1

## 2022-03-05 NOTE — ED Triage Notes (Signed)
C/O chest congestion, fever, sinus pressure x 2 days; minimal relief from OTC meds.

## 2022-03-05 NOTE — ED Notes (Addendum)
Called lab to add on d-dimer to previously collected labs  Lab called back stating it was hemolyzed, will redraw

## 2022-03-05 NOTE — ED Notes (Signed)
Unsuccessful IV attempt, pt states he normally gets [place] IV. Will have another staff member attempt IV

## 2022-03-05 NOTE — ED Provider Notes (Addendum)
[facility] Provider Note   CSN: [id number] Arrival date & time: 03/05/22  1105     History  Chief Complaint  Patient presents with   Nasal Congestion   HPI Wyatt Price is a 42 y.o. male with hypertension and diabetes presenting for nasal congestion.  States he has had nasal congestion, fever and sinus pressure for 2 days.  Also mentioned that he has been short of breath with some chest tightness.  Patient states that he stopped taking his blood pressure medicine a long time ago.  States he usually does not check his blood pressure at home. Chest tightness feels like soreness and is non radiating.  Shortness of breath is worse with exertion.  HPI     Home Medications Prior to Admission medications   Medication Sig Start Date End Date Taking? Authorizing Provider  amLODipine (NORVASC) 5 MG tablet Take 1 tablet (5 mg total) by mouth daily. 03/05/22  Yes Harriet Pho, PA-C  albuterol (VENTOLIN HFA) 108 (90 Base) MCG/ACT inhaler Inhale 1-2 puffs into the lungs every 6 (six) hours as needed for wheezing or shortness of breath. 08/11/18   Wieters, Hallie C, PA-C  citalopram (CELEXA) 20 MG tablet Take 20 mg by mouth daily.    [provider]  lidocaine (LIDODERM) 5 % Place 1 patch onto the skin daily. Remove & Discard patch within 12 hours or as directed by MD 01/07/21   Sherrill Raring, PA-C  LISINOPRIL PO Take by mouth.    [provider]  naproxen (NAPROSYN) 500 MG tablet Take 1 tablet (500 mg total) by mouth 2 (two) times daily. 01/07/21   Sherrill Raring, PA-C  venlafaxine XR (EFFEXOR-XR) 75 MG 24 hr capsule Take 75 mg by mouth daily with breakfast.    [provider]      Allergies    Patient has no known allergies.    Review of Systems   See HPI for pertinent positives   Physical Exam   Vitals:   03/05/22 1330 03/05/22 1400  BP: (!) 140/114 (!) 150/111  Pulse: (!) 103 97  Resp: 20 18  Temp:     SpO2: 98% 100%    CONSTITUTIONAL:  well-appearing, NAD NEURO:  Alert and oriented x 3, CN 3-12 grossly intact EYES:  eyes equal and reactive ENT/NECK:  Supple, no stridor  CARDIO:  tachycardic, regular rhythm, appears well-perfused  PULM:  No respiratory distress, CTAB GI/GU:  non-distended, soft MSK/SPINE:  No gross deformities, no edema, moves all extremities  SKIN:  no rash, atraumatic   *Additional and/or pertinent findings included in MDM below    ED Results / Procedures / Treatments   Labs (all labs ordered are listed, but only abnormal results are displayed) Labs Reviewed  BASIC METABOLIC PANEL - Abnormal; Notable for the following components:      Result Value   Glucose, Bld 131 (*)    Calcium 8.4 (*)    All other components within normal limits  CBC - Abnormal; Notable for the following components:   WBC 11.6 (*)    RBC 6.19 (*)    MCV 71.7 (*)    MCH 21.5 (*)    RDW 16.9 (*)    All other components within normal limits  RESP PANEL BY RT-PCR (RSV, FLU A&B, COVID)  RVPGX2  D-DIMER, QUANTITATIVE  TROPONIN I (HIGH SENSITIVITY)  TROPONIN I (HIGH SENSITIVITY)    EKG EKG Interpretation  Date/Time:  Wednesday March 05 2022 11:46:14  EST Ventricular Rate:  100 PR Interval:  169 QRS Duration: 89 QT Interval:  311 QTC Calculation: 401 R Axis:   29 Text Interpretation: Sinus tachycardia Ventricular premature complex Probable left atrial enlargement Nonspecific T abnormalities, lateral leads Baseline wander in lead(s) V2 No significant change since last tracing Confirmed by Isla Pence [phone]) on 03/05/2022 12:32:27 PM  Radiology DG Chest Port 1 View  Result Date: 03/05/2022 CLINICAL DATA:  Chest congestion, fever, and sinus pressure EXAM: PORTABLE CHEST 1 VIEW COMPARISON:  Chest radiograph dated 10/01/2018 FINDINGS: Lung volumes. No focal consolidations. No pleural effusion or pneumothorax. The heart size and mediastinal contours are within normal limits.  The visualized skeletal structures are unremarkable. IMPRESSION: No active disease. Electronically Signed   By: Darrin Nipper M.D.   On: 03/05/2022 12:03    Procedures Procedures    Medications Ordered in ED Medications  amLODipine (NORVASC) tablet 5 mg (has no administration in time range)    ED Course/ Medical Decision Making/ A&P                             Medical Decision Making Amount and/or Complexity of Data Reviewed Labs: ordered. Radiology: ordered.  Risk OTC drugs. Prescription drug management.   42 year old male who is well-appearing presenting for nasal congestion.  Exam notable for tachycardia but otherwise reassuring.  DDx includes ACS, PE, URI, and pneumonia.  Given that patient is obese, with uncontrolled hypertension and diabetes and recent history of shortness of breath and chest tightness, felt it warranted further workup.  Fortunately troponins were negative and EKG was reassuring and nonischemic.  Also considered PE given the persistence of this tachycardia but D-dimer was negative and patient did not endorse risk factors.  Symptoms are consistent with a viral URI.  Patient stated that he used to take amlodipine.  Treated his elevated blood pressure with amlodipine and sent a 30-day supply to his pharmacy.  Advised him to follow-up with his PCP for his ongoing hypertension.  Tachycardic at discharge but otherwise stable.  Advised to follow-up with PCP for his persistent tachycardia and elevated blood pressure.        Final Clinical Impression(s) / ED Diagnoses Final diagnoses:  Elevated blood pressure reading  Nasal congestion    Rx / DC Orders ED Discharge Orders          Ordered    amLODipine (NORVASC) 5 MG tablet  Daily        03/05/22 1507              Harriet Pho, PA-C 03/05/22 1509    Harriet Pho, PA-C 03/05/22 1712    Isla Pence, MD 03/08/22 [phone]

## 2022-03-05 NOTE — Discharge Instructions (Signed)
Evaluation today was overall reassuring.  For this encounter you have been persistently tachycardic with an elevated blood pressure.  I do recommend that you follow-up with your PCP because of this.  I have also restarted you on a low-dose of amlodipine.  Recommend that you have a discussion with your PCP about managing her hypertension moving forward.  If you have worsening chest pain, shortness of breath, new calf tenderness or any other concerning symptom please return the emergency department for further evaluation.

## 2022-06-25 ENCOUNTER — Emergency Department (HOSPITAL_COMMUNITY)
Admission: EM | Admit: 2022-06-25 | Discharge: 2022-06-25 | Disposition: A | Discharge status: Home / Self Care | Payer: No Typology Code available for payment source | Attending: Emergency Medicine | Admitting: Emergency Medicine

## 2022-06-25 ENCOUNTER — Emergency Department (HOSPITAL_COMMUNITY): Payer: No Typology Code available for payment source

## 2022-06-25 ENCOUNTER — Other Ambulatory Visit: Payer: Self-pay

## 2022-06-25 ENCOUNTER — Encounter (HOSPITAL_COMMUNITY): Payer: Self-pay

## 2022-06-25 DIAGNOSIS — I10 Essential (primary) hypertension: Secondary | ICD-10-CM | POA: Insufficient documentation

## 2022-06-25 DIAGNOSIS — Z79899 Other long term (current) drug therapy: Secondary | ICD-10-CM | POA: Diagnosis not present

## 2022-06-25 DIAGNOSIS — R519 Headache, unspecified: Secondary | ICD-10-CM | POA: Diagnosis present

## 2022-06-25 LAB — CBC WITH DIFFERENTIAL/PLATELET
Abs Immature Granulocytes: 0.05 10*3/uL (ref 0.00–0.07)
Basophils Absolute: 0.1 10*3/uL (ref 0.0–0.1)
Basophils Relative: 1 %
Eosinophils Absolute: 0.2 10*3/uL (ref 0.0–0.5)
Eosinophils Relative: 1 %
HCT: 42.1 % (ref 39.0–52.0)
Hemoglobin: 12.7 g/dL — ABNORMAL LOW (ref 13.0–17.0)
Immature Granulocytes: 0 %
Lymphocytes Relative: 24 %
Lymphs Abs: 3.4 10*3/uL (ref 0.7–4.0)
MCH: 22 pg — ABNORMAL LOW (ref 26.0–34.0)
MCHC: 30.2 g/dL (ref 30.0–36.0)
MCV: 73 fL — ABNORMAL LOW (ref 80.0–100.0)
Monocytes Absolute: 1.3 10*3/uL — ABNORMAL HIGH (ref 0.1–1.0)
Monocytes Relative: 9 %
Neutro Abs: 8.9 10*3/uL — ABNORMAL HIGH (ref 1.7–7.7)
Neutrophils Relative %: 65 %
Platelets: 325 10*3/uL (ref 150–400)
RBC: 5.77 MIL/uL (ref 4.22–5.81)
RDW: 16.8 % — ABNORMAL HIGH (ref 11.5–15.5)
WBC: 14 10*3/uL — ABNORMAL HIGH (ref 4.0–10.5)
nRBC: 0 % (ref 0.0–0.2)

## 2022-06-25 LAB — COMPREHENSIVE METABOLIC PANEL
ALT: 18 U/L (ref 0–44)
AST: 23 U/L (ref 15–41)
Albumin: 3.8 g/dL (ref 3.5–5.0)
Alkaline Phosphatase: 126 U/L (ref 38–126)
Anion gap: 7 (ref 5–15)
BUN: 9 mg/dL (ref 6–20)
CO2: 28 mmol/L (ref 22–32)
Calcium: 8.5 mg/dL — ABNORMAL LOW (ref 8.9–10.3)
Chloride: 103 mmol/L (ref 98–111)
Creatinine, Ser: 0.91 mg/dL (ref 0.61–1.24)
GFR, Estimated: >60 mL/min (ref >60)
Glucose, Bld: 99 mg/dL (ref 70–99)
Potassium: 3.3 mmol/L — ABNORMAL LOW (ref 3.5–5.1)
Sodium: 138 mmol/L (ref 135–145)
Total Bilirubin: 0.8 mg/dL (ref 0.3–1.2)
Total Protein: 7.6 g/dL (ref 6.5–8.1)

## 2022-06-25 LAB — TROPONIN I (HIGH SENSITIVITY): Troponin I (High Sensitivity): 10 ng/L (ref <18)

## 2022-06-25 MED ORDER — AMLODIPINE BESYLATE 10 MG PO TABS: 10.0000 mg | ORAL_TABLET | Freq: Every day | ORAL | 0 refills | Status: AC

## 2022-06-25 NOTE — ED Provider Notes (Signed)
[place] EMERGENCY DEPARTMENT AT [facility] Provider Note   CSN: [id number] Arrival date & time: 06/25/22  1744     History Chief Complaint  Patient presents with   Headache   Hypertension    HPI Wyatt Price is a 42 y.o. male presenting for chief complaint of elevated blood pressure readings.  States that he had a headache at work this evening and checked his blood pressure was 180/130.  States he has not seen his primary care doctor and while he is on amlodipine and lisinopril and is only intermittently compliant.  Does state he took them today however.  Denies fevers chills nausea vomiting syncope or shortness of breath..   Patient's recorded medical, surgical, social, medication list and allergies were reviewed in the Snapshot window as part of the initial history.   Review of Systems   Review of Systems  Constitutional:  Negative for chills and fever.  HENT:  Negative for ear pain and sore throat.   Eyes:  Negative for pain and visual disturbance.  Respiratory:  Negative for cough and shortness of breath.   Cardiovascular:  Negative for chest pain and palpitations.  Gastrointestinal:  Negative for abdominal pain and vomiting.  Genitourinary:  Negative for dysuria and hematuria.  Musculoskeletal:  Negative for arthralgias and back pain.  Skin:  Negative for color change and rash.  Neurological:  Positive for headaches. Negative for seizures and syncope.  All other systems reviewed and are negative.   Physical Exam Updated Vital Signs BP (!) 153/124   Pulse 86   Temp 98.5 F (36.9 C) (Oral)   Resp (!) 23   Ht 6\' 2"  (1.88 m)   Wt (!) 167.8 kg   SpO2 97%   BMI 47.51 kg/m  Physical Exam Vitals and nursing note reviewed.  Constitutional:      General: He is not in acute distress.    Appearance: He is well-developed.  HENT:     Head: Normocephalic and atraumatic.  Eyes:     Conjunctiva/sclera: Conjunctivae normal.  Cardiovascular:     Rate and  Rhythm: Normal rate and regular rhythm.     Heart sounds: No murmur heard. Pulmonary:     Effort: Pulmonary effort is normal. No respiratory distress.     Breath sounds: Normal breath sounds.  Abdominal:     Palpations: Abdomen is soft.     Tenderness: There is no abdominal tenderness.  Musculoskeletal:        General: No swelling.     Cervical back: Neck supple.  Skin:    General: Skin is warm and dry.     Capillary Refill: Capillary refill takes less than 2 seconds.  Neurological:     Mental Status: He is alert.  Psychiatric:        Mood and Affect: Mood normal.      ED Course/ Medical Decision Making/ A&P Clinical Course as of 06/25/22 2119  Wed Jun 25, 2022  2036 DG Chest Portable 1 View [CC]    Clinical Course User Index [CC] Glyn Ade, MD    Procedures Procedures   Medications Ordered in ED Medications - No data to display Medical Decision Making:   Wyatt Price is a 42 y.o. male who presented to the ED today with multiple comorbid medical problems detailed above.    Complete initial physical exam performed, notably the patient  was HDS in NAD.    Reviewed and confirmed nursing documentation for past medical history, family history, social history.  Initial Assessment:   With the patient's presentation of elevated blood pressure readings, most likely diagnosis is hypertensive urgency. Other diagnoses associated with hypertensive emergency were considered including (but not limited to) intracranial hemorrhage, acute renal artery stenosis, acute kidney injury, myocardial stress, ophthalmologic emergencies. These are considered less likely due to history of present illness and physical exam findings.   This is most consistent with an acute life/limb threatening illness complicated by underlying chronic conditions. Will evaluate for hypertensive emergency as below. Initial Plan:  Screening labs including CBC and Metabolic panel to evaluate for infectious  or metabolic etiology of disease.  Urinalysis with reflex culture ordered to evaluate for UTI or relevant urologic/nephrologic pathology.  CXR to evaluate for structural/infectious intrathoracic pathology.  EKG to evaluate for cardiac pathology. Objective evaluation as below reviewed. Considered further administration of antihypertensives in ED, per consensus guidelines for [facility] of emergency physicians, acute treatment of hypertensive urgency alone in the emergency department is not recommended.  If patient has evidence of hypertensive emergency on objective laboratory evaluation, will reevaluate.  Will monitor blood pressure while patient awaiting above laboratory studies.  Initial Study Results:   Laboratory  All laboratory results reviewed without evidence of clinically relevant pathology.   EKG EKG was reviewed independently. Rate, rhythm, axis, intervals all examined and without medically relevant abnormality. ST segments without concerns for elevations.    Radiology:  All images reviewed independently. Agree with radiology report at this time.   DG Chest Portable 1 View  Result Date: 06/25/2022 CLINICAL DATA:  Hypertension headaches EXAM: PORTABLE CHEST 1 VIEW COMPARISON:  03/05/2022 FINDINGS: The heart size and mediastinal contours are within normal limits. Both lungs are clear. The visualized skeletal structures are unremarkable. IMPRESSION: No active disease. Electronically Signed   By: Alcide Clever M.D.   On: 06/25/2022 20:18     Final Assessment and Plan:   Reassessed at bedside.  Symptoms grossly improved.  He is resting comfortably.  No acute evidence of hypertensive emergency.  Given ongoing hypertensive urgency in the outpatient setting we will increase his amlodipine to 10 mg daily and recommend he follow-up with his primary care provider as prior recommended. He is describing sleep apnea symptoms I recommended he follow-up with a sleep specialist for  study.  Disposition:  I have considered need for hospitalization, however, considering all of the above, I believe this patient is stable for discharge at this time.  Patient/family educated about specific return precautions for given chief complaint and symptoms.  Patient/family educated about follow-up with PCP.     Patient/family expressed understanding of return precautions and need for follow-up. Patient spoken to regarding all imaging and laboratory results and appropriate follow up for these results. All education provided in verbal form with additional information in written form. Time was allowed for answering of patient questions. Patient discharged.    Emergency Department Medication Summary:   Medications - No data to display   Clinical Impression:  1. Hypertension, unspecified type      Data Unavailable   Final Clinical Impression(s) / ED Diagnoses Final diagnoses:  Hypertension, unspecified type    Rx / DC Orders ED Discharge Orders          Ordered    amLODipine (NORVASC) 10 MG tablet  Daily        06/25/22 2118    Ambulatory referral to Sleep Studies        06/25/22 2119  Glyn Ade, MD 06/25/22 2243

## 2022-06-25 NOTE — ED Triage Notes (Signed)
Pt was at work today and noticed that he had a headache, was seen by medical on the job and they noted the pts blood pressure was 183/125. Pt states that he has not had his blood pressure lately.

## 2023-01-10 ENCOUNTER — Encounter (HOSPITAL_BASED_OUTPATIENT_CLINIC_OR_DEPARTMENT_OTHER): Payer: Self-pay

## 2023-01-10 ENCOUNTER — Emergency Department (HOSPITAL_BASED_OUTPATIENT_CLINIC_OR_DEPARTMENT_OTHER)
Admission: EM | Admit: 2023-01-10 | Discharge: 2023-01-10 | Disposition: A | Discharge status: Home / Self Care | Payer: 59 | Attending: Emergency Medicine | Admitting: Emergency Medicine

## 2023-01-10 ENCOUNTER — Other Ambulatory Visit: Payer: Self-pay

## 2023-01-10 DIAGNOSIS — J069 Acute upper respiratory infection, unspecified: Secondary | ICD-10-CM | POA: Insufficient documentation

## 2023-01-10 DIAGNOSIS — I10 Essential (primary) hypertension: Secondary | ICD-10-CM | POA: Insufficient documentation

## 2023-01-10 DIAGNOSIS — R110 Nausea: Secondary | ICD-10-CM | POA: Diagnosis present

## 2023-01-10 DIAGNOSIS — R519 Headache, unspecified: Secondary | ICD-10-CM | POA: Diagnosis not present

## 2023-01-10 DIAGNOSIS — E119 Type 2 diabetes mellitus without complications: Secondary | ICD-10-CM | POA: Insufficient documentation

## 2023-01-10 DIAGNOSIS — Z1152 Encounter for screening for COVID-19: Secondary | ICD-10-CM | POA: Diagnosis not present

## 2023-01-10 LAB — RESP PANEL BY RT-PCR (RSV, FLU A&B, COVID)  RVPGX2
Influenza A by PCR: NEGATIVE
Influenza B by PCR: NEGATIVE
Resp Syncytial Virus by PCR: NEGATIVE
SARS Coronavirus 2 by RT PCR: NEGATIVE

## 2023-01-10 NOTE — ED Triage Notes (Signed)
The patient is having headache, nausea and chills for three days.

## 2023-01-10 NOTE — ED Provider Notes (Cosign Needed)
[place] EMERGENCY DEPARTMENT AT [facility] Provider Note   CSN: [id number] Arrival date & time: 01/10/23  1357     History  Chief Complaint  Patient presents with   Headache   Nausea    Braydan Finer is a 42 y.o. male with history of diabetes, hypertension, obesity, presents with concern for nausea, malaise, and some diarrhea over the past 3 days.  Also reports subjective chills but no fever.  He reports he also had a headache when this first started, but this has since resolved.  No neck pain.  No cough or congestion.  He has been taking Pepto-Bismol at home which helps with his nausea and diarrhea.  Has been able to eat and drink normally.  States his mother had something similar.   Headache Associated symptoms: diarrhea        Home Medications Prior to Admission medications   Medication Sig Start Date End Date Taking? Authorizing Provider  albuterol (VENTOLIN HFA) 108 (90 Base) MCG/ACT inhaler Inhale 1-2 puffs into the lungs every 6 (six) hours as needed for wheezing or shortness of breath. 08/11/18   Wieters, Hallie C, PA-C  amLODipine (NORVASC) 10 MG tablet Take 1 tablet (10 mg total) by mouth daily. 06/25/22   Glyn Ade, MD  citalopram (CELEXA) 20 MG tablet Take 20 mg by mouth daily.    [provider]  guaiFENesin-dextromethorphan (ROBITUSSIN DM) 100-10 MG/5ML syrup Take 5 mLs by mouth every 4 (four) hours as needed for cough. 03/05/22   Gareth Eagle, PA-C  lidocaine (LIDODERM) 5 % Place 1 patch onto the skin daily. Remove & Discard patch within 12 hours or as directed by MD 01/07/21   Theron Arista, PA-C  LISINOPRIL PO Take by mouth.    [provider]  naproxen (NAPROSYN) 500 MG tablet Take 1 tablet (500 mg total) by mouth 2 (two) times daily. 01/07/21   Theron Arista, PA-C  venlafaxine XR (EFFEXOR-XR) 75 MG 24 hr capsule Take 75 mg by mouth daily with breakfast.    [provider]      Allergies    Pineapple     Review of Systems   Review of Systems  Constitutional:  Positive for chills.  Gastrointestinal:  Positive for diarrhea.    Physical Exam Updated Vital Signs BP (!) 138/108 (BP Location: Right Arm)   Pulse 95   Temp 98.8 F (37.1 C)   Resp 14   Ht 6\' 2"  (1.88 m)   Wt (!) 167 kg   SpO2 100%   BMI 47.27 kg/m  Physical Exam Vitals and nursing note reviewed.  Constitutional:      General: He is not in acute distress.    Appearance: He is well-developed.  HENT:     Head: Normocephalic and atraumatic.  Eyes:     Conjunctiva/sclera: Conjunctivae normal.  Cardiovascular:     Rate and Rhythm: Normal rate and regular rhythm.     Heart sounds: No murmur heard. Pulmonary:     Effort: Pulmonary effort is normal. No respiratory distress.     Breath sounds: Normal breath sounds.  Abdominal:     Palpations: Abdomen is soft.     Tenderness: There is no abdominal tenderness.  Musculoskeletal:        General: No swelling.     Cervical back: Neck supple.  Skin:    General: Skin is warm and dry.     Capillary Refill: Capillary refill takes less than 2 seconds.  Neurological:  Mental Status: He is alert.  Psychiatric:        Mood and Affect: Mood normal.     ED Results / Procedures / Treatments   Labs (all labs ordered are listed, but only abnormal results are displayed) Labs Reviewed  RESP PANEL BY RT-PCR (RSV, FLU A&B, COVID)  [id number]    EKG None  Radiology No results found.  Procedures Procedures    Medications Ordered in ED Medications - No data to display  ED Course/ Medical Decision Making/ A&P                                 Medical Decision Making    Differential diagnosis includes but is not limited to COVID, flu, RSV, viral URI, strep pharyngitis, viral pharyngitis, allergic rhinitis, pneumonia, bronchitis   ED Course:  Patient well-appearing, stable vital signs aside for a slightly elevated blood pressure at 138/108 upon arrival.  He has  been having nausea, diarrhea, general malaise with past couple of days.  Still able to eat and drink normally.  He is negative for flu, COVID, RSV.  Suspect viral URI.  Low concern for pneumonia at this time given no cough, fevers, symptoms only ongoing for past 3 days.  He has been able to manage symptoms at home with Pepto-Bismol.  Declines any further medication at this time for his nausea or other symptoms.  Patient stable and appropriate for discharge home at this time.   Impression: Viral URI  Disposition:  The patient was discharged home with instructions to use over-the-counter medications as needed.  Keep well-hydrated. Return precautions given.                Final Clinical Impression(s) / ED Diagnoses Final diagnoses:  Viral URI    Rx / DC Orders ED Discharge Orders     None         Arabella Merles, [place] 01/10/23 1616

## 2023-01-10 NOTE — Discharge Instructions (Signed)
You appear to have an upper respiratory infection (URI). An upper respiratory tract infection, or cold, is a viral infection of the air passages leading to the lungs. It should improve gradually after 5-7 days. You may have a lingering cough that lasts for 2- 4 weeks after the infection.  Your flu, covid, and RSV test were negative today    Home care instructions:  You can take Tylenol and/or Ibuprofen as directed on the packaging for fever reduction and pain relief.    For cough: honey 1/2 to 1 teaspoon (you can dilute the honey in water or another fluid).  You can also use guaifenesin and dextromethorphan for cough which are over-the-counter medications. You can use a humidifier for chest congestion and cough.  If you don't have a humidifier, you can sit in the bathroom with the hot shower running.      For sore throat: try warm salt water gargles, cepacol lozenges, throat spray, warm tea or water with lemon/honey, popsicles or ice, or OTC cold relief medicine for throat discomfort.    For congestion: Flonase 1-2 sprays in each nostril daily.    It is important to stay hydrated: drink plenty of fluids (water, gatorade/powerade/pedialyte, juices, or teas) to keep your throat moisturized and help further relieve irritation/discomfort.   Your illness is contagious and can be spread to others, especially during the first 3 or 4 days. It cannot be cured by antibiotics or other medicines. Take basic precautions such as washing your hands often, covering your mouth when you cough or sneeze, and avoiding public places where you could spread your illness to others.    Follow-up instructions: Please follow-up with your primary care provider in the next 3 days for further evaluation of your symptoms if you are not feeling better.   Return instructions:  Please return to the Emergency Department if you experience worsening symptoms.  RETURN IMMEDIATELY IF you develop shortness of breath, confusion or  altered mental status, a new rash, become dizzy, faint, or poorly responsive, or are unable to be cared for at home. Please return if you have persistent vomiting and cannot keep down fluids or develop a fever that is not controlled by tylenol or motrin.   Please return if you have any other emergent concerns.

## 2023-12-25 ENCOUNTER — Emergency Department (HOSPITAL_BASED_OUTPATIENT_CLINIC_OR_DEPARTMENT_OTHER): Payer: Self-pay

## 2023-12-25 ENCOUNTER — Encounter (HOSPITAL_BASED_OUTPATIENT_CLINIC_OR_DEPARTMENT_OTHER): Payer: Self-pay | Admitting: *Deleted

## 2023-12-25 ENCOUNTER — Other Ambulatory Visit: Payer: Self-pay

## 2023-12-25 ENCOUNTER — Emergency Department (HOSPITAL_BASED_OUTPATIENT_CLINIC_OR_DEPARTMENT_OTHER)
Admission: EM | Admit: 2023-12-25 | Discharge: 2023-12-25 | Disposition: A | Discharge status: Home / Self Care | Payer: Self-pay | Attending: Emergency Medicine | Admitting: Emergency Medicine

## 2023-12-25 DIAGNOSIS — R9431 Abnormal electrocardiogram [ECG] [EKG]: Secondary | ICD-10-CM | POA: Insufficient documentation

## 2023-12-25 DIAGNOSIS — E119 Type 2 diabetes mellitus without complications: Secondary | ICD-10-CM | POA: Insufficient documentation

## 2023-12-25 DIAGNOSIS — Z79899 Other long term (current) drug therapy: Secondary | ICD-10-CM | POA: Insufficient documentation

## 2023-12-25 DIAGNOSIS — R030 Elevated blood-pressure reading, without diagnosis of hypertension: Secondary | ICD-10-CM | POA: Insufficient documentation

## 2023-12-25 DIAGNOSIS — J329 Chronic sinusitis, unspecified: Secondary | ICD-10-CM | POA: Insufficient documentation

## 2023-12-25 DIAGNOSIS — I10 Essential (primary) hypertension: Secondary | ICD-10-CM | POA: Insufficient documentation

## 2023-12-25 DIAGNOSIS — H81399 Other peripheral vertigo, unspecified ear: Secondary | ICD-10-CM | POA: Insufficient documentation

## 2023-12-25 LAB — CBC WITH DIFFERENTIAL/PLATELET
Abs Immature Granulocytes: 0.07 K/uL (ref 0.00–0.07)
Basophils Absolute: 0.1 K/uL (ref 0.0–0.1)
Basophils Relative: 1 %
Eosinophils Absolute: 0.2 K/uL (ref 0.0–0.5)
Eosinophils Relative: 1 %
HCT: 46.5 % (ref 39.0–52.0)
Hemoglobin: 14.5 g/dL (ref 13.0–17.0)
Immature Granulocytes: 1 %
Lymphocytes Relative: 21 %
Lymphs Abs: 2.6 K/uL (ref 0.7–4.0)
MCH: 23.2 pg — ABNORMAL LOW (ref 26.0–34.0)
MCHC: 31.2 g/dL (ref 30.0–36.0)
MCV: 74.3 fL — ABNORMAL LOW (ref 80.0–100.0)
Monocytes Absolute: 1.3 K/uL — ABNORMAL HIGH (ref 0.1–1.0)
Monocytes Relative: 11 %
Neutro Abs: 7.8 K/uL — ABNORMAL HIGH (ref 1.7–7.7)
Neutrophils Relative %: 65 %
Platelets: 303 K/uL (ref 150–400)
RBC: 6.26 MIL/uL — ABNORMAL HIGH (ref 4.22–5.81)
RDW: 16.6 % — ABNORMAL HIGH (ref 11.5–15.5)
WBC: 12 K/uL — ABNORMAL HIGH (ref 4.0–10.5)
nRBC: 0 % (ref 0.0–0.2)

## 2023-12-25 LAB — COMPREHENSIVE METABOLIC PANEL WITH GFR
ALT: 14 U/L (ref 0–44)
AST: 24 U/L (ref 15–41)
Albumin: 4.3 g/dL (ref 3.5–5.0)
Alkaline Phosphatase: 146 U/L — ABNORMAL HIGH (ref 38–126)
Anion gap: 10 (ref 5–15)
BUN: 7 mg/dL (ref 6–20)
CO2: 30 mmol/L (ref 22–32)
Calcium: 9.2 mg/dL (ref 8.9–10.3)
Chloride: 100 mmol/L (ref 98–111)
Creatinine, Ser: 0.86 mg/dL (ref 0.61–1.24)
GFR, Estimated: >60 mL/min (ref >60)
Glucose, Bld: 98 mg/dL (ref 70–99)
Potassium: 3.9 mmol/L (ref 3.5–5.1)
Sodium: 141 mmol/L (ref 135–145)
Total Bilirubin: 0.4 mg/dL (ref 0.0–1.2)
Total Protein: 7.5 g/dL (ref 6.5–8.1)

## 2023-12-25 LAB — TROPONIN T, HIGH SENSITIVITY: Troponin T High Sensitivity: <15 ng/L (ref 0–19)

## 2023-12-25 MED ORDER — MECLIZINE HCL 25 MG PO TABS: 25.0000 mg | ORAL_TABLET | Freq: Two times a day (BID) | ORAL | 0 refills | Status: AC | PRN

## 2023-12-25 NOTE — Discharge Instructions (Signed)
 It was a pleasure caring for you today in the emergency department.  Be sure to take your blood pressure medication as prescribed.  Recommend you follow a low-salt/heart healthy diet as outlined on handout.  Recommend continue medications that were started in urgent care to treat your presumed sinus infection.  Recommend you also use a humidifier in your bedroom, use sinus rinse twice daily.  Follow-up PCP in the office  Please return to the emergency department for any worsening or worrisome symptoms.

## 2023-12-25 NOTE — ED Triage Notes (Signed)
 Pt was seen at [facility] due to feeling unwell with a sinus infection for which he has been taking antibiotics since Wednesday but he continues to feel unwell and he has been hypertensive.  Pt paperwork advises that he should go to [facility] [address] and that he declined EMS transport.  They wasn't his to have further evaluation of dizziness/fatigue in the setting of uncontrolled HTN and abnormal EKG (TWI in inferolateral leads that appears new/differen and not called ion last 2020 EKG read   Pt was given 324mg  ASA at [facility].  No CP or sob.

## 2023-12-25 NOTE — ED Provider Notes (Signed)
 [place] [facility] Provider Note  CSN: [id number] Arrival date & time: 12/25/23 1432  Chief Complaint(s) Hypertension and Abnormal ECG  HPI Wyatt Price is a 43 y.o. male with past medical history as below, significant for depression, DM, hypertension, obesity who presents to the ED with complaint of elevated blood pressure reading, abnormal EKG  Patient was seen in urgent care earlier today with complaint of dizziness, fatigue, elevated blood pressure reading.  Intermittent positional dizziness.  Concern for abnormal EKG at urgent care, he was given aspirin, no complaint of chest pain or dyspnea.  Patient reports URI type symptoms over the past week, he is being treated with antibiotics via outside facility and decongestant.  He has been having sinus pressure, congestion, ear fullness, intermittent spinning sensation with position changes over the past 24 hours.  No weakness or numbness to his extremities, no falls or head injuries, no headache.  No hearing loss or tinnitus.  No diplopia or vision loss.  No speech disturbance, no dysphagia.  Patient also reports he did not take his blood pressure medication today, blood pressure is elevated in urgent care he also had reportedly abnormal EKG and was sent to the ER for further evaluation  Past Medical History Past Medical History:  Diagnosis Date   Depression    Diabetes mellitus without complication (HCC)    Hypertension    Obesity    There are no active problems to display for this patient.  Home Medication(s) Prior to Admission medications  Medication Sig Start Date End Date Taking? Authorizing Provider  meclizine (ANTIVERT) 25 MG tablet Take 1 tablet (25 mg total) by mouth 2 (two) times daily as needed for dizziness. 12/25/23  Yes Elnor Jayson LABOR, DO  albuterol  (VENTOLIN  HFA) 108 (90 Base) MCG/ACT inhaler Inhale 1-2 puffs into the lungs every 6 (six) hours as needed for wheezing or  shortness of breath. 08/11/18   Wieters, Hallie C, PA-C  amLODipine  (NORVASC ) 10 MG tablet Take 1 tablet (10 mg total) by mouth daily. 06/25/22   Jerral Meth, MD  citalopram (CELEXA) 20 MG tablet Take 20 mg by mouth daily.    [provider]  guaiFENesin -dextromethorphan  (ROBITUSSIN DM) 100-10 MG/5ML syrup Take 5 mLs by mouth every 4 (four) hours as needed for cough. 03/05/22   Robinson, John K, PA-C  lidocaine  (LIDODERM ) 5 % Place 1 patch onto the skin daily. Remove & Discard patch within 12 hours or as directed by MD 01/07/21   Emelia Sluder, PA-C  LISINOPRIL PO Take by mouth.    [provider]  naproxen  (NAPROSYN ) 500 MG tablet Take 1 tablet (500 mg total) by mouth 2 (two) times daily. 01/07/21   Emelia Sluder, PA-C  venlafaxine XR (EFFEXOR-XR) 75 MG 24 hr capsule Take 75 mg by mouth daily with breakfast.    [provider]  Past Surgical History History reviewed. No pertinent surgical history. Family History History reviewed. No pertinent family history.  Social History Social History[1] Allergies Pineapple  Review of Systems A thorough review of systems was obtained and all systems are negative except as noted in the HPI and PMH.   Physical Exam Vital Signs  I have reviewed the triage vital signs BP (!) 156/135   Pulse (!) 102   Temp 99.4 F (37.4 C) (Oral)   Resp 20   Ht 6' 2 (1.88 m)   Wt (!) 165.1 kg   SpO2 100%   BMI 46.73 kg/m  Physical Exam Vitals and nursing note reviewed.  Constitutional:      General: He is not in acute distress.    Appearance: He is well-developed. He is obese.  HENT:     Head: Normocephalic and atraumatic.     Right Ear: External ear normal. No middle ear effusion. No mastoid tenderness.     Left Ear: External ear normal. A middle ear effusion is present. No mastoid tenderness.      Mouth/Throat:     Mouth: Mucous membranes are moist.  Eyes:     General: No scleral icterus.    Extraocular Movements: Extraocular movements intact.     Pupils: Pupils are equal, round, and reactive to light.  Cardiovascular:     Rate and Rhythm: Normal rate and regular rhythm.     Pulses: Normal pulses.     Heart sounds: Normal heart sounds.  Pulmonary:     Effort: Pulmonary effort is normal. No respiratory distress.     Breath sounds: Normal breath sounds.  Abdominal:     General: Abdomen is flat.     Palpations: Abdomen is soft.     Tenderness: There is no abdominal tenderness.  Musculoskeletal:     Cervical back: No rigidity.     Right lower leg: No edema.     Left lower leg: No edema.  Skin:    General: Skin is warm and dry.     Capillary Refill: Capillary refill takes less than 2 seconds.  Neurological:     Mental Status: He is alert and oriented to person, place, and time.     GCS: GCS eye subscore is 4. GCS verbal subscore is 5. GCS motor subscore is 6.     Cranial Nerves: Cranial nerves 2-12 are intact.     Sensory: Sensation is intact.     Motor: Motor function is intact.     Coordination: Coordination is intact.     Gait: Gait is intact.     Comments: Strength 5/5 to BLUE/BLLE, equal and symmetric    Psychiatric:        Mood and Affect: Mood normal.        Behavior: Behavior normal.     ED Results and Treatments Labs (all labs ordered are listed, but only abnormal results are displayed) Labs Reviewed  CBC WITH DIFFERENTIAL/PLATELET - Abnormal; Notable for the following components:      Result Value   WBC 12.0 (*)    RBC 6.26 (*)    MCV 74.3 (*)    MCH 23.2 (*)    RDW 16.6 (*)    Neutro Abs 7.8 (*)    Monocytes Absolute 1.3 (*)    All other components within normal limits  COMPREHENSIVE METABOLIC PANEL WITH GFR - Abnormal; Notable for the following components:   Alkaline Phosphatase 146 (*)    All other components within normal limits  TROPONIN T,  HIGH SENSITIVITY                                                                                                                          Radiology DG Chest 2 View Result Date: 12/25/2023 CLINICAL DATA:  Hypertensive EXAM: CHEST - 2 VIEW COMPARISON:  06/25/2022 FINDINGS: The heart size and mediastinal contours are within normal limits. Both lungs are clear. The visualized skeletal structures are unremarkable. IMPRESSION: No active cardiopulmonary disease. Electronically Signed   By: Luke Bun M.D.   On: 12/25/2023 16:25    Pertinent labs & imaging results that were available during my care of the patient were reviewed by me and considered in my medical decision making (see MDM for details).  Medications Ordered in ED Medications - No data to display                                                                                                                                   Procedures Procedures  (including critical care time)  Medical Decision Making / ED Course    Medical Decision Making:    Takoda Janowiak is a 43 y.o. male  with past medical history as below, significant for depression, DM, hypertension, obesity who presents to the ED with complaint of elevated blood pressure reading, abnormal EKG. The complaint involves an extensive differential diagnosis and also carries with it a high risk of complications and morbidity.  Serious etiology was considered. Ddx includes but is not limited to: Asymptomatic hypertension, poorly controlled hypertension, hypertensive emergency, renal dysfunction, peripheral vertigo, central vertigo, dehydration, viral syndrome, etc.  Complete initial physical exam performed, notably the patient was in no acute distress.    Reviewed and confirmed nursing documentation for past medical history, family history, social history.  Vital signs reviewed.    Concern abnormal EKG> - EKG reviewed from today, similar to prior tracing, no STEMI, no acute  ischemic changes.  He has no chest pain, dyspnea or palpitations - labs stable - trop neg, no cp, doubt ACS given neg trop and stable EKG, no cp  Elevated blood pressure reading> - Presentation today is not consistent with hypertensive emergency, renal function is stable.  Asymptomatic hypertension - He is prescribed Norvasc  and lisinopril, did not take today - doubt hypertensive emergency, advised pt to take his BP medication as prescribed, DASH diet.  Sinusitis  Peripheral vertigo > -  Patient is being treated for sinusitis, he has some ongoing congestion and sinus pressure.  No fevers, chills, nausea or vomiting, he is neuro intact.  He has slight fatigable horizontal nystagmus.  Spinning sensation is positional.  He has middle ear effusion on the left. - Spinning sensation likely secondary to peripheral vertigo.  Will start meclizine, advised to continue decongestants, sinus rinse.  Follow-up PCP. Doubt central etiology given non-focal exam - CXR wnl   6:00 PM:  I have discussed the diagnosis/risks/treatment options with the patient.  Evaluation and diagnostic testing in the emergency department does not suggest an emergent condition requiring admission or immediate intervention beyond what has been performed at this time.  They will follow up with PCP. We also discussed returning to the ED immediately if new or worsening sx occur. We discussed the sx which are most concerning (e.g., sudden worsening pain, fever, inability to tolerate by mouth) that necessitate immediate return.    The patient appears reasonably screened and/or stabilized for discharge and I doubt any other medical condition or other [facility] requiring further screening, evaluation, or treatment in the ED at this time prior to discharge.                      Additional history obtained: -Additional history obtained from na -External records from outside source obtained and reviewed including: Chart review  including previous notes, labs, imaging, consultation notes including  Prior UC documentation Home medications   Lab Tests: -I ordered, reviewed, and interpreted labs.   The pertinent results include:   Labs Reviewed  CBC WITH DIFFERENTIAL/PLATELET - Abnormal; Notable for the following components:      Result Value   WBC 12.0 (*)    RBC 6.26 (*)    MCV 74.3 (*)    MCH 23.2 (*)    RDW 16.6 (*)    Neutro Abs 7.8 (*)    Monocytes Absolute 1.3 (*)    All other components within normal limits  COMPREHENSIVE METABOLIC PANEL WITH GFR - Abnormal; Notable for the following components:   Alkaline Phosphatase 146 (*)    All other components within normal limits  TROPONIN T, HIGH SENSITIVITY    Notable for labs stable  EKG   EKG Interpretation Date/Time:  Friday December 25 2023 14:49:17 EST Ventricular Rate:  103 PR Interval:  167 QRS Duration:  93 QT Interval:  304 QTC Calculation: 398 R Axis:   43  Text Interpretation: Sinus tachycardia Ventricular premature complex Nonspecific T abnormalities, inferior leads similar to prior tracing no stemi Confirmed by Elnor Savant ([id number]) on 12/25/2023 3:15:00 PM         Imaging Studies ordered: I ordered imaging studies including CXR I independently visualized the following imaging with scope of interpretation limited to determining acute life threatening conditions related to emergency care; findings noted above I agree with the radiologist interpretation If any imaging was obtained with contrast I closely monitored patient for any possible adverse reaction a/w contrast administration in the emergency department   Medicines ordered and prescription drug management: Meds ordered this encounter  Medications   meclizine (ANTIVERT) 25 MG tablet    Sig: Take 1 tablet (25 mg total) by mouth 2 (two) times daily as needed for dizziness.    Dispense:  15 tablet    Refill:  0    -I have reviewed the patients home medicines and have made  adjustments as needed   Consultations Obtained: na   Cardiac Monitoring: Continuous  pulse oximetry interpreted by myself, 100% on RA.    Social Determinants of Health:  Diagnosis or treatment significantly limited by social determinants of health: obesity, uninsured    Reevaluation: After the interventions noted above, I reevaluated the patient and found that they have improved  Co morbidities that complicate the patient evaluation  Past Medical History:  Diagnosis Date   Depression    Diabetes mellitus without complication (HCC)    Hypertension    Obesity       Dispostion: Disposition decision including need for hospitalization was considered, and patient discharged from emergency department.    Final Clinical Impression(s) / ED Diagnoses Final diagnoses:  Elevated blood pressure reading  Peripheral vertigo, unspecified laterality         [1]  Social History Tobacco Use   Smoking status: Never   Smokeless tobacco: Never  Vaping Use   Vaping status: Never Used  Substance Use Topics   Alcohol use: No   Drug use: No     Elnor Jayson LABOR, DO 12/25/23 1800

## 2024-01-01 ENCOUNTER — Other Ambulatory Visit: Payer: Self-pay

## 2024-01-01 ENCOUNTER — Emergency Department (HOSPITAL_BASED_OUTPATIENT_CLINIC_OR_DEPARTMENT_OTHER): Payer: Self-pay

## 2024-01-01 ENCOUNTER — Emergency Department (HOSPITAL_BASED_OUTPATIENT_CLINIC_OR_DEPARTMENT_OTHER)
Admission: EM | Admit: 2024-01-01 | Discharge: 2024-01-01 | Disposition: A | Discharge status: Home / Self Care | Payer: Self-pay | Attending: Emergency Medicine | Admitting: Emergency Medicine

## 2024-01-01 ENCOUNTER — Encounter (HOSPITAL_BASED_OUTPATIENT_CLINIC_OR_DEPARTMENT_OTHER): Payer: Self-pay

## 2024-01-01 DIAGNOSIS — E119 Type 2 diabetes mellitus without complications: Secondary | ICD-10-CM | POA: Insufficient documentation

## 2024-01-01 DIAGNOSIS — R42 Dizziness and giddiness: Secondary | ICD-10-CM | POA: Insufficient documentation

## 2024-01-01 DIAGNOSIS — I10 Essential (primary) hypertension: Secondary | ICD-10-CM | POA: Insufficient documentation

## 2024-01-01 DIAGNOSIS — R072 Precordial pain: Secondary | ICD-10-CM | POA: Insufficient documentation

## 2024-01-01 LAB — CBC
HCT: 45.2 % (ref 39.0–52.0)
Hemoglobin: 14 g/dL (ref 13.0–17.0)
MCH: 22.6 pg — ABNORMAL LOW (ref 26.0–34.0)
MCHC: 31 g/dL (ref 30.0–36.0)
MCV: 72.9 fL — ABNORMAL LOW (ref 80.0–100.0)
Platelets: 359 K/uL (ref 150–400)
RBC: 6.2 MIL/uL — ABNORMAL HIGH (ref 4.22–5.81)
RDW: 15.1 % (ref 11.5–15.5)
WBC: 14.6 K/uL — ABNORMAL HIGH (ref 4.0–10.5)
nRBC: 0 % (ref 0.0–0.2)

## 2024-01-01 LAB — BASIC METABOLIC PANEL WITH GFR
Anion gap: 12 (ref 5–15)
BUN: 11 mg/dL (ref 6–20)
CO2: 28 mmol/L (ref 22–32)
Calcium: 9.3 mg/dL (ref 8.9–10.3)
Chloride: 101 mmol/L (ref 98–111)
Creatinine, Ser: 1.07 mg/dL (ref 0.61–1.24)
GFR, Estimated: >60 mL/min
Glucose, Bld: 108 mg/dL — ABNORMAL HIGH (ref 70–99)
Potassium: 3.7 mmol/L (ref 3.5–5.1)
Sodium: 141 mmol/L (ref 135–145)

## 2024-01-01 LAB — TROPONIN T, HIGH SENSITIVITY
Troponin T High Sensitivity: <15 ng/L (ref 0–19)
Troponin T High Sensitivity: <15 ng/L (ref 0–19)

## 2024-01-01 NOTE — ED Provider Notes (Signed)
 Patient is clearly anxious on exam and while he denies this as a cause symptoms started after he was in a break room with someone who started coughing and was not masked.  I favor anxiety.     Ziyah Cordoba, MD 01/01/24 [phone]

## 2024-01-01 NOTE — ED Triage Notes (Signed)
 Patient here POV from Home.  States he was at work a few hours ago when be began to feel lightheaded (moving boxes around when this occurred), SOB, anxious. Occurrence lasted about an hour but still feels lightheaded.   No pain currently but states he felt some CP yesterday. No fevers.   NAD noted during Triage. A&Ox4. Gcs 15. Ambulatory.

## 2024-01-01 NOTE — ED Provider Notes (Signed)
 " [place] [facility] Provider Note   CSN: [id number] Arrival date & time: 01/01/24  0145     Patient presents with: Dizziness   Wyatt Price is a 43 y.o. male.   The history is provided by the patient.  Dizziness Quality:  Lightheadedness Severity:  Moderate Onset quality:  Sudden Timing:  Constant Progression:  Unchanged Chronicity:  New Context: not with ear pain, not with loss of consciousness and not when urinating   Relieved by:  Nothing Worsened by:  Nothing Ineffective treatments:  None tried Associated symptoms: no diarrhea, no vision changes, no vomiting and no weakness   Associated symptoms comment:  Had CP yesterday but is resolved  Risk factors: no Meniere's disease, no multiple medications and no new medications   Patient with DM and depression who presents with lightheadedness.  No weakness nor numbness.  No changes in vision or speech.  No DOE.  No n/v/d.  Had an episode of CP yesterday.      Past Medical History:  Diagnosis Date   Depression    Diabetes mellitus without complication (HCC)    Hypertension    Obesity      Prior to Admission medications  Medication Sig Start Date End Date Taking? Authorizing Provider  albuterol  (VENTOLIN  HFA) 108 (90 Base) MCG/ACT inhaler Inhale 1-2 puffs into the lungs every 6 (six) hours as needed for wheezing or shortness of breath. 08/11/18   Wieters, Hallie C, PA-C  amLODipine  (NORVASC ) 10 MG tablet Take 1 tablet (10 mg total) by mouth daily. 06/25/22   Jerral Meth, MD  citalopram (CELEXA) 20 MG tablet Take 20 mg by mouth daily.    [provider]  guaiFENesin -dextromethorphan  (ROBITUSSIN DM) 100-10 MG/5ML syrup Take 5 mLs by mouth every 4 (four) hours as needed for cough. 03/05/22   Robinson, John K, PA-C  lidocaine  (LIDODERM ) 5 % Place 1 patch onto the skin daily. Remove & Discard patch within 12 hours or as directed by MD 01/07/21   Emelia Sluder, PA-C  LISINOPRIL  PO Take by mouth.    [provider]  meclizine  (ANTIVERT ) 25 MG tablet Take 1 tablet (25 mg total) by mouth 2 (two) times daily as needed for dizziness. 12/25/23   Elnor Jayson LABOR, DO  naproxen  (NAPROSYN ) 500 MG tablet Take 1 tablet (500 mg total) by mouth 2 (two) times daily. 01/07/21   Emelia Sluder, PA-C  venlafaxine XR (EFFEXOR-XR) 75 MG 24 hr capsule Take 75 mg by mouth daily with breakfast.    [provider]    Allergies: Pineapple    Review of Systems  Gastrointestinal:  Negative for diarrhea and vomiting.  Neurological:  Positive for dizziness. Negative for weakness.    Updated Vital Signs BP 120/88   Pulse 90   Temp 99 F (37.2 C) (Oral)   Resp 20   Ht 6' 2 (1.88 m)   Wt (!) 165.1 kg   SpO2 96%   BMI 46.73 kg/m   Physical Exam Vitals and nursing note reviewed.  Constitutional:      General: He is not in acute distress.    Appearance: He is well-developed. He is not diaphoretic.  HENT:     Head: Normocephalic and atraumatic.     Nose: Nose normal.  Eyes:     Conjunctiva/sclera: Conjunctivae normal.     Pupils: Pupils are equal, round, and reactive to light.  Cardiovascular:     Rate and Rhythm: Normal rate and regular rhythm.  Pulses: Normal pulses.     Heart sounds: Normal heart sounds.  Pulmonary:     Effort: Pulmonary effort is normal.     Breath sounds: Normal breath sounds. No wheezing or rales.  Abdominal:     General: Bowel sounds are normal.     Palpations: Abdomen is soft.     Tenderness: There is no abdominal tenderness. There is no guarding or rebound.  Musculoskeletal:        General: Normal range of motion.     Cervical back: Normal range of motion and neck supple.  Skin:    General: Skin is warm and dry.     Capillary Refill: Capillary refill takes less than 2 seconds.  Neurological:     General: No focal deficit present.     Mental Status: He is alert and oriented to person, place, and time.     (all labs ordered  are listed, but only abnormal results are displayed) Results for orders placed or performed during the hospital encounter of 01/01/24  Basic metabolic panel   Collection Time: 01/01/24  2:27 AM  Result Value Ref Range   Sodium 141 135 - 145 mmol/L   Potassium 3.7 3.5 - 5.1 mmol/L   Chloride 101 98 - 111 mmol/L   CO2 28 22 - 32 mmol/L   Glucose, Bld 108 (H) 70 - 99 mg/dL   BUN 11 6 - 20 mg/dL   Creatinine, Ser 8.92 0.61 - 1.24 mg/dL   Calcium 9.3 8.9 - 89.6 mg/dL   GFR, Estimated >39 >39 mL/min   Anion gap 12 5 - 15  CBC   Collection Time: 01/01/24  2:27 AM  Result Value Ref Range   WBC 14.6 (H) 4.0 - 10.5 K/uL   RBC 6.20 (H) 4.22 - 5.81 MIL/uL   Hemoglobin 14.0 13.0 - 17.0 g/dL   HCT 54.7 60.9 - 47.9 %   MCV 72.9 (L) 80.0 - 100.0 fL   MCH 22.6 (L) 26.0 - 34.0 pg   MCHC 31.0 30.0 - 36.0 g/dL   RDW 84.8 88.4 - 84.4 %   Platelets 359 150 - 400 K/uL   nRBC 0.0 0.0 - 0.2 %  Troponin T, High Sensitivity   Collection Time: 01/01/24  2:27 AM  Result Value Ref Range   Troponin T High Sensitivity <15 0 - 19 ng/L  Troponin T, High Sensitivity   Collection Time: 01/01/24  4:10 AM  Result Value Ref Range   Troponin T High Sensitivity <15 0 - 19 ng/L   CT Head Wo Contrast Result Date: 01/01/2024 EXAM: CT HEAD WITHOUT CONTRAST 01/01/2024 04:37:56 AM TECHNIQUE: CT of the head was performed without the administration of intravenous contrast. Automated exposure control, iterative reconstruction, and/or weight based adjustment of the mA/kV was utilized to reduce the radiation dose to as low as reasonably achievable. COMPARISON: CT of the head dated 02/29/2016. CLINICAL HISTORY: Polytrauma, blunt. FINDINGS: BRAIN AND VENTRICLES: No acute hemorrhage. No evidence of acute infarct. No hydrocephalus. No extra-axial collection. No mass effect or midline shift. There are prominent dural calcifications along the falx again demonstrated. 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 There is no evidence of acute intracranial injury. ORBITS: No acute abnormality. SINUSES: No acute abnormality. SOFT TISSUES AND SKULL: No acute soft tissue abnormality. No skull fracture. IMPRESSION: 1. No acute intracranial abnormality. 2. 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 Electronically signed by: Evalene  Hugh MD 01/01/2024 04:55 AM EST RP Workstation: [id number]   DG Chest 2 View Result Date: 01/01/2024 EXAM: 2 VIEW(S) XRAY OF THE CHEST 01/01/2024 02:22:00 AM COMPARISON: 12/25/2023 CLINICAL HISTORY: CP, lightheadedness, SOB FINDINGS: LUNGS AND PLEURA: No focal pulmonary opacity. No pleural effusion. No pneumothorax. HEART AND MEDIASTINUM: No acute abnormality of the cardiac and mediastinal silhouettes. BONES AND SOFT TISSUES: No acute osseous abnormality. IMPRESSION: 1. No acute cardiopulmonary pathology. Electronically signed by: Oneil Devonshire MD 01/01/2024 02:35 AM EST RP Workstation: [id number]   DG Chest 2 View Result Date: 12/25/2023 CLINICAL DATA:  Hypertensive EXAM: CHEST - 2 VIEW COMPARISON:  06/25/2022 FINDINGS: The heart size and mediastinal contours are within normal limits. Both lungs are clear. The visualized skeletal structures are unremarkable. IMPRESSION: No active cardiopulmonary disease. Electronically Signed   By: Luke Bun M.D.   On: 12/25/2023 16:25    EKG: Rate 111 Sinus tachycardia Borderline repolarization abnormality   Radiology: CT Head Wo Contrast Result Date: 01/01/2024 EXAM: CT HEAD WITHOUT CONTRAST 01/01/2024 04:37:56 AM TECHNIQUE: CT of the head was performed without the administration of intravenous contrast. Automated exposure control, iterative reconstruction, and/or weight based adjustment of the mA/kV was utilized to reduce the radiation dose to as low as reasonably achievable. COMPARISON: CT of the head dated 02/29/2016. CLINICAL HISTORY: Polytrauma, blunt.  FINDINGS: BRAIN AND VENTRICLES: No acute hemorrhage. No evidence of acute infarct. No hydrocephalus. No extra-axial collection. No mass effect or midline shift. There are prominent dural calcifications along the falx again demonstrated. 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 There is no evidence of acute intracranial injury. ORBITS: No acute abnormality. SINUSES: No acute abnormality. SOFT TISSUES AND SKULL: No acute soft tissue abnormality. No skull fracture. IMPRESSION: 1. No acute intracranial abnormality. 2. 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 Electronically signed by: Evalene Hugh MD 01/01/2024 04:55 AM EST RP Workstation: [id number]   DG Chest 2 View Result Date: 01/01/2024 EXAM: 2 VIEW(S) XRAY OF THE CHEST 01/01/2024 02:22:00 AM COMPARISON: 12/25/2023 CLINICAL HISTORY: CP, lightheadedness, SOB FINDINGS: LUNGS AND PLEURA: No focal pulmonary opacity. No pleural effusion. No pneumothorax. HEART AND MEDIASTINUM: No acute abnormality of the cardiac and mediastinal silhouettes. BONES AND SOFT TISSUES: No acute osseous abnormality. IMPRESSION: 1. No acute cardiopulmonary pathology. Electronically signed by: Oneil Devonshire MD 01/01/2024 02:35 AM EST RP Workstation: [id number]     Procedures   Medications Ordered in the ED - No data to display                                  Medical Decision Making Patient with lightheaded at work and CP the previous day   Amount and/or Complexity of Data Reviewed External Data Reviewed: labs, radiology, ECG and notes.    Details: Previous visits reviewed  Labs: ordered.    Details: 2 negative troponins < 15.  White count slight elevation 14.6, normal hemoglobin 14, normal platelets.  Normal sodium 141 normal potassium 3.7 normal creatinine 1.07.   Radiology: ordered and independent interpretation performed.    Details: Negative head CT for  acute findings  ECG/medicine tests: ordered and independent interpretation performed. Decision-making details documented in ED Course.  Risk Risk Details: Well appearing.  Ruled out for MI in the ED.  EKG is unchanged.  No PNA on CXR.  Wells 0, I do not believe this is a PE.  Very well appearing.  Stable for discharge with close follow up.  Strict returns.      Final diagnoses:  Lightheadedness  Precordial pain    No signs of systemic illness or infection. The patient is nontoxic-appearing on exam and vital signs are within normal limits.  I have reviewed the triage vital signs and the nursing notes. Pertinent labs & imaging results that were available during my care of the patient were reviewed by me and considered in my medical decision making (see chart for details). After history, exam, and medical workup I feel the patient has been appropriately medically screened and is safe for discharge home. Pertinent diagnoses were discussed with the patient. Patient was given return precautions.  ED Discharge Orders     None          Adryanna Friedt, MD 01/01/24 [id number]  "

## 2024-01-05 ENCOUNTER — Encounter (HOSPITAL_BASED_OUTPATIENT_CLINIC_OR_DEPARTMENT_OTHER): Payer: Self-pay

## 2024-01-05 ENCOUNTER — Emergency Department (HOSPITAL_BASED_OUTPATIENT_CLINIC_OR_DEPARTMENT_OTHER)
Admission: EM | Admit: 2024-01-05 | Discharge: 2024-01-05 | Disposition: A | Discharge status: Home / Self Care | Payer: Self-pay | Attending: Emergency Medicine | Admitting: Emergency Medicine

## 2024-01-05 ENCOUNTER — Emergency Department (HOSPITAL_BASED_OUTPATIENT_CLINIC_OR_DEPARTMENT_OTHER): Payer: Self-pay

## 2024-01-05 ENCOUNTER — Other Ambulatory Visit: Payer: Self-pay

## 2024-01-05 DIAGNOSIS — R42 Dizziness and giddiness: Secondary | ICD-10-CM | POA: Insufficient documentation

## 2024-01-05 LAB — CBC WITH DIFFERENTIAL/PLATELET
Abs Immature Granulocytes: 0.04 K/uL (ref 0.00–0.07)
Basophils Absolute: 0.1 K/uL (ref 0.0–0.1)
Basophils Relative: 1 %
Eosinophils Absolute: 0.2 K/uL (ref 0.0–0.5)
Eosinophils Relative: 1 %
HCT: 45.1 % (ref 39.0–52.0)
Hemoglobin: 13.9 g/dL (ref 13.0–17.0)
Immature Granulocytes: 0 %
Lymphocytes Relative: 23 %
Lymphs Abs: 3 K/uL (ref 0.7–4.0)
MCH: 22.6 pg — ABNORMAL LOW (ref 26.0–34.0)
MCHC: 30.8 g/dL (ref 30.0–36.0)
MCV: 73.5 fL — ABNORMAL LOW (ref 80.0–100.0)
Monocytes Absolute: 1.1 K/uL — ABNORMAL HIGH (ref 0.1–1.0)
Monocytes Relative: 9 %
Neutro Abs: 8.4 K/uL — ABNORMAL HIGH (ref 1.7–7.7)
Neutrophils Relative %: 66 %
Platelets: 342 K/uL (ref 150–400)
RBC: 6.14 MIL/uL — ABNORMAL HIGH (ref 4.22–5.81)
RDW: 15.4 % (ref 11.5–15.5)
WBC: 12.8 K/uL — ABNORMAL HIGH (ref 4.0–10.5)
nRBC: 0 % (ref 0.0–0.2)

## 2024-01-05 LAB — BASIC METABOLIC PANEL WITH GFR
Anion gap: 11 (ref 5–15)
BUN: 7 mg/dL (ref 6–20)
CO2: 29 mmol/L (ref 22–32)
Calcium: 9.1 mg/dL (ref 8.9–10.3)
Chloride: 100 mmol/L (ref 98–111)
Creatinine, Ser: 0.84 mg/dL (ref 0.61–1.24)
GFR, Estimated: >60 mL/min
Glucose, Bld: 99 mg/dL (ref 70–99)
Potassium: 3.8 mmol/L (ref 3.5–5.1)
Sodium: 139 mmol/L (ref 135–145)

## 2024-01-05 LAB — MAGNESIUM: Magnesium: 2.3 mg/dL (ref 1.7–2.4)

## 2024-01-05 LAB — TROPONIN T, HIGH SENSITIVITY: Troponin T High Sensitivity: <15 ng/L (ref 0–19)

## 2024-01-05 NOTE — ED Triage Notes (Signed)
 Lightheaded since this morning.  Denies chest pain, SHOB. Sent by UC for abnormal EKG

## 2024-01-05 NOTE — ED Provider Notes (Signed)
 " [place] [facility] Provider Note   CSN: [id number] Arrival date & time: 01/05/24  1008     Patient presents with: Dizziness   Wyatt Price is a 43 y.o. male.   43 yo M with a chief complaints of not feeling well.  He says he feels lightheaded off and on.  He was recently seen in the ED for the same.  He has gone to urgent care before both visits and both times were told his EKG was abnormal and he had to come here for evaluation.  He denies any chest pain but was short of breath transiently prior to his last ED visit.  He felt he was having some tenderness to his left pectoral muscle about a week ago that he thinks has resolved.  Recently was diagnosed with sinusitis at urgent care and has finished his antibiotic therapy.  Denies any new medications or supplements.  Denies continued cough congestion or fever.  Denies abdominal pain.  He has been eating a bit less than normal.   Dizziness      Prior to Admission medications  Medication Sig Start Date End Date Taking? Authorizing Provider  celecoxib (CELEBREX) 200 MG capsule Take 200 mg by mouth daily. 05/14/23  Yes [provider]  gabapentin (NEURONTIN) 300 MG capsule See admin instructions. 1 PM x 1week then, 2 PM x 1 week then, 1 AM + 2PM x 1week, then 2 AM + 2PM 05/29/23  Yes [provider]  albuterol  (VENTOLIN  HFA) 108 (90 Base) MCG/ACT inhaler Inhale 1-2 puffs into the lungs every 6 (six) hours as needed for wheezing or shortness of breath. 08/11/18   Wieters, Hallie C, PA-C  amLODipine  (NORVASC ) 10 MG tablet Take 1 tablet (10 mg total) by mouth daily. 06/25/22   Jerral Meth, MD  citalopram (CELEXA) 20 MG tablet Take 20 mg by mouth daily.    [provider]  guaiFENesin -dextromethorphan  (ROBITUSSIN DM) 100-10 MG/5ML syrup Take 5 mLs by mouth every 4 (four) hours as needed for cough. 03/05/22   Robinson, John K, PA-C  lidocaine  (LIDODERM ) 5 % Place 1 patch onto  the skin daily. Remove & Discard patch within 12 hours or as directed by MD 01/07/21   Emelia Sluder, PA-C  LISINOPRIL PO Take by mouth.    [provider]  meclizine  (ANTIVERT ) 25 MG tablet Take 1 tablet (25 mg total) by mouth 2 (two) times daily as needed for dizziness. 12/25/23   Elnor Jayson LABOR, DO  naproxen  (NAPROSYN ) 500 MG tablet Take 1 tablet (500 mg total) by mouth 2 (two) times daily. 01/07/21   Emelia Sluder, PA-C  ondansetron  (ZOFRAN ) 4 MG tablet Take 4 mg by mouth every 8 (eight) hours as needed. 12/29/23 01/05/24  [provider]  venlafaxine XR (EFFEXOR-XR) 75 MG 24 hr capsule Take 75 mg by mouth daily with breakfast.    [provider]    Allergies: Pineapple    Review of Systems  Neurological:  Positive for dizziness.    Updated Vital Signs BP (!) 161/111 (BP Location: Left Wrist)   Pulse 86   Resp 14   SpO2 100%   Physical Exam Vitals and nursing note reviewed.  Constitutional:      Appearance: He is well-developed.  HENT:     Head: Normocephalic and atraumatic.  Eyes:     Pupils: Pupils are equal, round, and reactive to light.  Neck:     Vascular: No JVD.  Cardiovascular:  Rate and Rhythm: Normal rate and regular rhythm.     Heart sounds: No murmur heard.    No friction rub. No gallop.  Pulmonary:     Effort: No respiratory distress.     Breath sounds: No wheezing.  Abdominal:     General: There is no distension.     Tenderness: There is no abdominal tenderness. There is no guarding or rebound.  Musculoskeletal:        General: Normal range of motion.     Cervical back: Normal range of motion and neck supple.  Skin:    Coloration: Skin is not pale.     Findings: No rash.  Neurological:     Mental Status: He is alert and oriented to person, place, and time.  Psychiatric:        Behavior: Behavior normal.     (all labs ordered are listed, but only abnormal results are displayed) Labs Reviewed  CBC WITH  DIFFERENTIAL/PLATELET - Abnormal; Notable for the following components:      Result Value   WBC 12.8 (*)    RBC 6.14 (*)    MCV 73.5 (*)    MCH 22.6 (*)    Neutro Abs 8.4 (*)    Monocytes Absolute 1.1 (*)    All other components within normal limits  BASIC METABOLIC PANEL WITH GFR  MAGNESIUM  TROPONIN T, HIGH SENSITIVITY    EKG: None  Radiology: [facility] Chest Port 1 View Result Date: 01/05/2024 CLINICAL DATA:  Chest discomfort and dizziness. EXAM: PORTABLE CHEST 1 VIEW COMPARISON:  January 01, 2024 FINDINGS: The heart size and mediastinal contours are within normal limits. Both lungs are clear. The visualized skeletal structures are unremarkable. IMPRESSION: No active disease. Electronically Signed   By: Suzen Dials M.D.   On: 01/05/2024 12:34     Procedures   Medications Ordered in the ED - No data to display                                  Medical Decision Making Amount and/or Complexity of Data Reviewed Labs: ordered. Radiology: ordered.   43 yo male with a chief complaints of feeling lightheaded.  This has been an off-and-on issue for him after recovering from sinusitis.  Has now been seen in the ED twice for this.  Was seen in urgent care today and they thought that his EKG was abnormal.  EKG for me without obvious acute ischemic changes.  Normal sinus rhythm.  Will recheck electrolytes here.  Chest x-ray with reported shortness of breath last week and chest pain last week.  Troponin negative.  Chest x-ray independently interpreted by me without focal infiltrate or pneumothorax.  No acute anemia no significant electrolyte abnormalities.  Patient is mildly hypertensive here.  Endorses that he has not taken his medications as prescribed.  Will have him follow-up with his family doctor in the office.  12:38 PM:  I have discussed the diagnosis/risks/treatment options with the patient.  Evaluation and diagnostic testing in the emergency department does not suggest an  emergent condition requiring admission or immediate intervention beyond what has been performed at this time.  They will follow up with PCP. We also discussed returning to the ED immediately if new or worsening sx occur. We discussed the sx which are most concerning (e.g., sudden worsening pain, fever, inability to tolerate by mouth) that necessitate immediate return. Medications administered to the patient during  their visit and any new prescriptions provided to the patient are listed below.  Medications given during this visit Medications - No data to display   The patient appears reasonably screen and/or stabilized for discharge and I doubt any other medical condition or other [facility] requiring further screening, evaluation, or treatment in the ED at this time prior to discharge.       Final diagnoses:  Lightheadedness    ED Discharge Orders     None          Emil Share, DO 01/05/24 1238  "

## 2024-01-05 NOTE — Discharge Instructions (Signed)
 Follow up with your family doc in the office.   Please return for worsening symptoms if you cough up blood or if you pass out or feel you are in a pass out.  Please take your medications at home as prescribed.

## 2024-01-05 NOTE — ED Notes (Signed)
 Pt. Reports he had a sinus infection a couple of weeks ago.  Was on an abx. For this.

## 2024-01-05 NOTE — ED Notes (Signed)
 Pt. Did not take his B/P Meds today
